# Patient Record
Sex: Female | Born: 1985 | Race: White | Hispanic: No | Marital: Married | Smoking: Never smoker
Health system: Southern US, Community
[De-identification: ages and names within clinical notes are randomized; demographics above are authoritative.]

## PROBLEM LIST (undated history)

## (undated) DIAGNOSIS — Z1501 Genetic susceptibility to malignant neoplasm of breast: Secondary | ICD-10-CM

## (undated) DIAGNOSIS — F419 Anxiety disorder, unspecified: Secondary | ICD-10-CM

## (undated) HISTORY — PX: COSMETIC SURGERY: SHX468

## (undated) HISTORY — DX: Anxiety disorder, unspecified: F41.9

## (undated) HISTORY — PX: BREAST SURGERY: SHX581

## (undated) HISTORY — DX: Genetic susceptibility to malignant neoplasm of breast: Z15.01

---

## 2016-01-07 HISTORY — PX: LAPAROSCOPIC BILATERAL SALPINGO OOPHERECTOMY: SHX5890

## 2020-01-18 ENCOUNTER — Other Ambulatory Visit: Payer: Self-pay

## 2020-01-18 ENCOUNTER — Emergency Department (HOSPITAL_COMMUNITY): Payer: 59

## 2020-01-18 ENCOUNTER — Emergency Department (HOSPITAL_COMMUNITY)
Admission: EM | Admit: 2020-01-18 | Discharge: 2020-01-18 | Disposition: A | Discharge status: Home / Self Care | Payer: 59 | Attending: Emergency Medicine | Admitting: Emergency Medicine

## 2020-01-18 DIAGNOSIS — W540XXA Bitten by dog, initial encounter: Secondary | ICD-10-CM | POA: Diagnosis not present

## 2020-01-18 DIAGNOSIS — Y9389 Activity, other specified: Secondary | ICD-10-CM | POA: Diagnosis not present

## 2020-01-18 DIAGNOSIS — S6992XA Unspecified injury of left wrist, hand and finger(s), initial encounter: Secondary | ICD-10-CM | POA: Diagnosis present

## 2020-01-18 DIAGNOSIS — S62661B Nondisplaced fracture of distal phalanx of left index finger, initial encounter for open fracture: Secondary | ICD-10-CM | POA: Diagnosis not present

## 2020-01-18 DIAGNOSIS — Z23 Encounter for immunization: Secondary | ICD-10-CM | POA: Insufficient documentation

## 2020-01-18 MED ORDER — TETANUS-DIPHTH-ACELL PERTUSSIS 5-2.5-18.5 LF-MCG/0.5 IM SUSY
0.5000 mL | PREFILLED_SYRINGE | Freq: Once | INTRAMUSCULAR | Status: AC
  Administered 2020-01-18: 0.5 mL via INTRAMUSCULAR
  Filled 2020-01-18: qty 0.5

## 2020-01-18 MED ORDER — AMOXICILLIN-POT CLAVULANATE 875-125 MG PO TABS: 1.0000 | ORAL_TABLET | Freq: Two times a day (BID) | ORAL | 0 refills | Status: AC

## 2020-01-18 NOTE — Discharge Instructions (Signed)
Keep the area clean and dry.  Monitor for signs of infection which include worsening redness, swelling, pus discharge, fevers, severe pain to the finger.  Please wear the splint as much as possible.  Please take the antibiotic as prescribed until finished.  Please call Dr. Shelbie Ammons office at [facility] and schedule an appointment for sometime next week for follow-up.  You may take Tylenol or ibuprofen for pain.  Return to the ER for any new or worsening symptoms.

## 2020-01-18 NOTE — Progress Notes (Signed)
Orthopedic Tech Progress Note Patient Details:  Joyce Joseph Mar 31, 1985 [id number]  Ortho Devices Type of Ortho Device: Finger splint Ortho Device/Splint Location: lue index finger splint Ortho Device/Splint Interventions: Ordered,Application,Adjustment   Post Interventions Patient Tolerated: Well Instructions Provided: Care of device,Adjustment of device   Karolee Stamps 01/18/2020, 9:37 PM

## 2020-01-18 NOTE — ED Triage Notes (Signed)
Pt presents after was bitten by her dog. Appears to have punctured through L index fingernail through to the other side. Dog up to date on all shots.

## 2020-01-18 NOTE — ED Notes (Signed)
Pt's wound irrigated & dressed

## 2020-01-18 NOTE — ED Provider Notes (Signed)
[place] EMERGENCY DEPARTMENT Provider Note   CSN: [id number] Arrival date & time: 01/18/20  1846     History Chief Complaint  Patient presents with  . Finger Injury    Joyce Joseph is a 35 y.o. female.  HPI 35 year old female who presents to the ER with a dog bite to her left index finger.  She states that she was grooming her dog and had her hand out in front with a treat, and while grooming the dog accidentally bit down on her finger tomorrow.  The dog is up-to-date on the rabies shots.  Patient is unclear of her last tetanus shot.  Bleeding controlled here in the ER.  No numbness or tingling.  Reports crack in her nailbed.    No past medical history on file.  There are no problems to display for this patient.    The histories are not reviewed yet. Please review them in the "History" navigator section and refresh this [place].   OB History   No obstetric history on file.     No family history on file.     Home Medications Prior to Admission medications   Medication Sig Start Date End Date Taking? Authorizing Provider  amoxicillin-clavulanate (AUGMENTIN) 875-125 MG tablet Take 1 tablet by mouth every 12 (twelve) hours for 7 days. 01/18/20 01/25/20 Yes Garald Balding, PA-C    Allergies    Patient has no known allergies.  Review of Systems   Review of Systems  Musculoskeletal: Positive for arthralgias.  Skin: Positive for wound.  Neurological: Negative for numbness.     Physical Exam Updated Vital Signs BP 122/79   Pulse 78   Temp 98.6 F (37 C)   Resp 20   SpO2 98%   Physical Exam Vitals and nursing note reviewed.  Constitutional:      General: She is not in acute distress.    Appearance: She is well-developed and well-nourished.  HENT:     Head: Normocephalic and atraumatic.  Eyes:     Conjunctiva/sclera: Conjunctivae normal.  Cardiovascular:     Rate and Rhythm: Normal rate and regular rhythm.     Heart sounds: No  murmur heard.   Pulmonary:     Effort: Pulmonary effort is normal. No respiratory distress.     Breath sounds: Normal breath sounds.  Abdominal:     Palpations: Abdomen is soft.     Tenderness: There is no abdominal tenderness.  Musculoskeletal:        General: Tenderness and signs of injury present. No swelling, deformity or edema.     Cervical back: Neck supple.     Comments:  Full ROM of the DIP, sensations intact   Skin:    General: Skin is warm and dry.     Capillary Refill: Capillary refill takes less than 2 seconds.     Findings: Erythema present.     Comments: 1 cm horizontal crack to the nailbed above the lunula extending into the lateral nail fold on the right. Small 0.5cm laceration to the volar aspect of finger bad on the left finger.  Neurological:     General: No focal deficit present.     Mental Status: She is alert and oriented to person, place, and time.  Psychiatric:        Mood and Affect: Mood and affect and mood normal.        Behavior: Behavior normal.         ED Results / Procedures /  Treatments   Labs (all labs ordered are listed, but only abnormal results are displayed) Labs Reviewed - No data to display  EKG None  Radiology DG Finger Index Left  Result Date: 01/18/2020 CLINICAL DATA:  Dog bite EXAM: LEFT INDEX FINGER 2+V COMPARISON:  None. FINDINGS: There is a nondisplaced transverse fracture of the tuft of the left index finger. No foreign body. Skin laceration obscured by bandaging. No other osseous abnormality. IMPRESSION: Nondisplaced transverse fracture of the tuft of the left index finger. No foreign body. Electronically Signed   By: Ulyses Jarred M.D.   On: 01/18/2020 19:24    Procedures Procedures (including critical care time)  Medications Ordered in ED Medications  Tdap (BOOSTRIX) injection 0.5 mL (0.5 mLs Intramuscular Given 01/18/20 2006)    ED Course  I have reviewed the triage vital signs and the nursing notes.  Pertinent  labs & imaging results that were available during my care of the patient were reviewed by me and considered in my medical decision making (see chart for details).    MDM Rules/Calculators/A&P                          35 year old female with dog bite to the finger.  Evidence of nail injury.  Plain films with nondisplaced transverse fracture of the tuft of the left index finger.  Tetanus updated here in the ER.  Neurovascularly intact.  Spoke with Dr. Jeannie Fend with hand surgery as there is concern for possible open fracture.  He recommends cleaning it well, wrapping, placing a finger splint and having him follow-up in office.  Will consent Augmentin.  Referral provided.  Encouraged Tylenol/ibuprofen for pain.  Educated on wound care.  Return precautions discussed.  She voiced understanding and is agreeable.   Final Clinical Impression(s) / ED Diagnoses Final diagnoses:  Nondisplaced fracture of distal phalanx of left index finger, initial encounter for open fracture    Rx / DC Orders ED Discharge Orders         Ordered    amoxicillin-clavulanate (AUGMENTIN) 875-125 MG tablet  Every 12 hours        01/18/20 2112           Garald Balding, PA-C 01/18/20 2116    Horton, Alvin Critchley, DO 01/19/20 0000

## 2020-03-06 ENCOUNTER — Telehealth: Payer: 59 | Admitting: Internal Medicine

## 2020-04-04 ENCOUNTER — Telehealth: Payer: 59 | Admitting: Internal Medicine

## 2020-04-10 ENCOUNTER — Telehealth: Payer: 59 | Admitting: Family

## 2020-04-17 ENCOUNTER — Ambulatory Visit: Payer: 59 | Admitting: Nurse Practitioner

## 2020-05-15 ENCOUNTER — Ambulatory Visit (INDEPENDENT_AMBULATORY_CARE_PROVIDER_SITE_OTHER): Payer: 59 | Admitting: Nurse Practitioner

## 2020-05-15 ENCOUNTER — Other Ambulatory Visit: Payer: Self-pay

## 2020-05-15 ENCOUNTER — Encounter: Payer: Self-pay | Admitting: Nurse Practitioner

## 2020-05-15 VITALS — BP 99/61 | HR 72 | Temp 99.0°F | Ht 68.0 in | Wt 135.6 lb

## 2020-05-15 DIAGNOSIS — Z1239 Encounter for other screening for malignant neoplasm of breast: Secondary | ICD-10-CM | POA: Diagnosis not present

## 2020-05-15 DIAGNOSIS — Z9013 Acquired absence of bilateral breasts and nipples: Secondary | ICD-10-CM

## 2020-05-15 DIAGNOSIS — Z8271 Family history of polycystic kidney: Secondary | ICD-10-CM

## 2020-05-15 DIAGNOSIS — Z1502 Genetic susceptibility to malignant neoplasm of ovary: Secondary | ICD-10-CM

## 2020-05-15 DIAGNOSIS — F5101 Primary insomnia: Secondary | ICD-10-CM | POA: Insufficient documentation

## 2020-05-15 DIAGNOSIS — Z1501 Genetic susceptibility to malignant neoplasm of breast: Secondary | ICD-10-CM

## 2020-05-15 DIAGNOSIS — Z90722 Acquired absence of ovaries, bilateral: Secondary | ICD-10-CM | POA: Insufficient documentation

## 2020-05-15 DIAGNOSIS — Z9079 Acquired absence of other genital organ(s): Secondary | ICD-10-CM

## 2020-05-15 DIAGNOSIS — Z01419 Encounter for gynecological examination (general) (routine) without abnormal findings: Secondary | ICD-10-CM | POA: Insufficient documentation

## 2020-05-15 DIAGNOSIS — Z7689 Persons encountering health services in other specified circumstances: Secondary | ICD-10-CM | POA: Insufficient documentation

## 2020-05-15 DIAGNOSIS — Z13820 Encounter for screening for osteoporosis: Secondary | ICD-10-CM

## 2020-05-15 DIAGNOSIS — Z780 Asymptomatic menopausal state: Secondary | ICD-10-CM

## 2020-05-15 DIAGNOSIS — Z1509 Genetic susceptibility to other malignant neoplasm: Secondary | ICD-10-CM

## 2020-05-15 NOTE — Progress Notes (Signed)
New Patient Office Visit  Subjective:  Patient ID: Joyce Joseph, female    DOB: 08/11/1985  Age: 35 y.o. MRN: [id number]  CC:  Chief Complaint  Patient presents with  . New Patient (Initial Visit)    HPI Joyce Joseph presents to establish new primary care provider. She moved to this area about 1 year ago from [place]. She states that she is generally healthy. She states that she is positive for BRCA 1 genetic mutation, as is her sister. Patient states that her mother has metastatic breast cancer. The patient has had prophylactic bilateral mastectomy. The surgery was done along with reconstruction in 2018. She has not had imaging of the breasts since the surgery. Was told by her surgeon that imaging should be done about every three years. The patient states that she also had bilateral salpingectomy and oophorectomy to prevent development of reproductive cancers. She has been in surgical menopause for past three years. She takes citalopram 53m everyday. This helps her manage chronic anxiety, but also helps to control hot flashes. She states that this medication has been working well. She should have a baseline bone density test.  The patient states that her sister, now about 348years old, has polycystic kidney disease. Sister has recently started having dysfunction of the kidneys and hypertension as a result of the PCKD. The patient states that she, herself, has never been evaluated for PCKD.   The patient states that she has been having trouble with sleep. States that she takes an OTC medication most nights to help her fall asleep, though it doesn't['t always help her to stay asleep. She states that she has tried multiple OTC supplements to help her sleep better. Would like to consider prescription alternative but does not want to start anything new today.  She is due to have routine, fasting blood work. She needs to have a physical and pap smear.   Past Medical History:  Diagnosis Date  .  Anxiety    Phreesia 03/03/2020    Past Surgical History:  Procedure Laterality Date  . BREAST SURGERY N/A    [employer]reesia 03/03/2020  . LAPAROSCOPIC BILATERAL SALPINGO OOPHERECTOMY Bilateral 2018    Family History  Problem Relation Age of Onset  . Breast cancer Mother     Social History   Socioeconomic History  . Marital status: Married    Spouse name: Not on file  . Number of children: Not on file  . Years of education: Not on file  . Highest education level: Not on file  Occupational History  . Not on file  Tobacco Use  . Smoking status: Never Smoker  . Smokeless tobacco: Never Used  Substance and Sexual Activity  . Alcohol use: Yes  . Drug use: Never  . Sexual activity: Yes  Other Topics Concern  . Not on file  Social History Narrative  . Not on file   Social Determinants of Health   Financial Resource Strain: Not on file  Food Insecurity: Not on file  Transportation Needs: Not on file  Physical Activity: Not on file  Stress: Not on file  Social Connections: Not on file  Intimate Partner Violence: Not on file    ROS Review of Systems  Constitutional: Negative for activity change, chills and fever.  HENT: Negative for congestion, sinus pressure, sinus pain and sore throat.   Eyes: Negative.   Respiratory: Negative for cough, chest tightness and wheezing.   Cardiovascular: Negative for chest pain and palpitations.  Gastrointestinal: Negative for  constipation, diarrhea, nausea and vomiting.  Endocrine: Positive for heat intolerance. Negative for cold intolerance, polydipsia, polyphagia and polyuria.       Intermittent hot flashes. Well managed on current dose citalopram.   Genitourinary: Negative for dyspareunia, frequency and vaginal discharge.  Musculoskeletal: Negative for back pain and myalgias.  Skin: Negative for rash.  Allergic/Immunologic: Negative.   Neurological: Negative for dizziness, weakness and headaches.  Psychiatric/Behavioral: Positive for  sleep disturbance. The patient is nervous/anxious.   All other systems reviewed and are negative.   Objective:   Today's Vitals   05/15/20 0950  BP: 99/61  Pulse: 72  Temp: 99 F (37.2 C)  SpO2: 100%  Weight: 135 lb 9.6 oz (61.5 kg)  Height: _0  (1.727 m)   Body mass index is 20.62 kg/m.    Physical Exam Vitals and nursing note reviewed.  Constitutional:      Appearance: Normal appearance. She is well-developed.  HENT:     Head: Normocephalic and atraumatic.     Nose: Nose normal.  Eyes:     Extraocular Movements: Extraocular movements intact.     Conjunctiva/sclera: Conjunctivae normal.     Pupils: Pupils are equal, round, and reactive to light.  Cardiovascular:     Rate and Rhythm: Normal rate and regular rhythm.     Pulses: Normal pulses.     Heart sounds: Normal heart sounds.  Pulmonary:     Effort: Pulmonary effort is normal.     Breath sounds: Normal breath sounds.  Abdominal:     Palpations: Abdomen is soft.  Musculoskeletal:        General: Normal range of motion.     Cervical back: Normal range of motion and neck supple.  Skin:    General: Skin is warm and dry.  Neurological:     General: No focal deficit present.     Mental Status: She is alert and oriented to person, place, and time.  Psychiatric:        Mood and Affect: Mood normal.        Behavior: Behavior normal.        Thought Content: Thought content normal.        Judgment: Judgment normal.     Assessment & Plan:  1. Encounter to establish care Appointment today to establish new primary care provider.   2. BRCA1 gene mutation positive in female Bilateral mastectomy with breast reconstruction. Will get MRI of bilateral breasts for screening for breast cancer and to evaluate for any abnormalities.  - MR BREAST BILATERAL WO CONTRAST; Future  3. History of mastectomy, bilateral Bilateral mastectomy with breast reconstruction. Will get MRI of bilateral breasts for screening for breast  cancer and to evaluate for any abnormalities.  - MR BREAST BILATERAL WO CONTRAST; Future  4. Encounter for breast cancer screening other than mammogram Bilateral mastectomy with breast reconstruction. Will get MRI of bilateral breasts for screening for breast cancer and to evaluate for any abnormalities.  - MR BREAST BILATERAL WO CONTRAST; Future  5. History of bilateral salpingo-oophorectomy Surgical menopause for past three years. Will get baseline bone density test for screening for osteoporosis.  - DG Bone Density; Future  6. Encounter for osteoporosis screening in asymptomatic postmenopausal patient Surgical menopause for past three years. Will get baseline bone density test for screening for osteoporosis.  - DG Bone Density; Future  7. Family history of polycystic kidney disease Will get ultrasound of both kidneys for evaluation of PCKD. Labs to be done prior to  next visit to evaluate renal functions. Blood pressure very well managed.  - US Renal; Future  8. Primary insomnia Discussed trial of trazodone at bedtime to help with insomnia. Patient states she would like to consider this and will discuss further at next visit.   Problem List Items Addressed This Visit      Other   Encounter to establish care - Primary   BRCA1 gene mutation positive in female   Relevant Orders   MR BREAST BILATERAL WO CONTRAST   History of mastectomy, bilateral   Relevant Orders   MR BREAST BILATERAL WO CONTRAST   Encounter for breast cancer screening other than mammogram   Relevant Orders   MR BREAST BILATERAL WO CONTRAST   History of bilateral salpingo-oophorectomy   Relevant Orders   DG Bone Density   Encounter for osteoporosis screening in asymptomatic postmenopausal patient   Relevant Orders   DG Bone Density   Family history of polycystic kidney disease   Relevant Orders   US Renal   Primary insomnia      Outpatient Encounter Medications as of 05/15/2020  Medication Sig  .  citalopram (CELEXA) 20 MG tablet Take 20 mg by mouth daily.  . [DISCONTINUED] citalopram (CELEXA) 20 MG tablet 1 tablet   No facility-administered encounter medications on file as of 05/15/2020.    Follow-up: Return in about 4 weeks (around 06/12/2020) for physical with pap - FBW about 1 week prior .   Ronnell Freshwater, NP

## 2020-05-15 NOTE — Patient Instructions (Signed)
Bone Density Test A bone density test uses a type of X-ray to measure the amount of calcium and other minerals in a person's bones. It can measure bone density in the hip and the spine. The test is similar to having a regular X-ray. This test may also be called:  Bone densitometry.  Bone mineral density test.  Dual-energy X-ray absorptiometry (DEXA). You may have this test to:  Diagnose a condition that causes weak or thin bones (osteoporosis).  Screen you for osteoporosis.  Predict your risk for a broken bone (fracture).  Determine how well your osteoporosis treatment is working. Tell a health care provider about:  Any allergies you have.  All medicines you are taking, including vitamins, herbs, eye drops, creams, and over-the-counter medicines.  Any problems you or family members have had with anesthetic medicines.  Any blood disorders you have.  Any surgeries you have had.  Any medical conditions you have.  Whether you are pregnant or may be pregnant.  Any medical tests you have had within the past 14 days that used contrast material. What are the risks? Generally, this is a safe test. However, it does expose you to a small amount of radiation, which can slightly increase your cancer risk. What happens before the test?  Do not take any calcium supplements within the 24 hours before your test.  You will need to remove all metal jewelry, eyeglasses, removable dental appliances, and any other metal objects on your body. What happens during the test?  You will lie down on an exam table. There will be an X-ray generator below you and an imaging device above you.  Other devices, such as boxes or braces, may be used to position your body properly for the scan.  The machine will slowly scan your body. You will need to keep very still while the machine does the scan.  The images will show up on a screen in the room. Images will be examined by a specialist after your test is  finished. The procedure may vary among health care providers and hospitals.   What can I expect after the test? It is up to you to get the results of your test. Ask your health care provider, or the department that is doing the test, when your results will be ready. Summary  A bone density test is an imaging test that uses a type of X-ray to measure the amount of calcium and other minerals in your bones.  The test may be used to diagnose or screen you for a condition that causes weak or thin bones (osteoporosis), predict your risk for a broken bone (fracture), or determine how well your osteoporosis treatment is working.  Do not take any calcium supplements within 24 hours before your test.  Ask your health care provider, or the department that is doing the test, when your results will be ready. This information is not intended to replace advice given to you by your health care provider. Make sure you discuss any questions you have with your health care provider. Document Revised: 06/09/2019 Document Reviewed: 06/09/2019 Elsevier Patient Education  2021 [employer] Inc.  

## 2020-06-11 ENCOUNTER — Other Ambulatory Visit: Payer: Self-pay

## 2020-06-11 DIAGNOSIS — Z1329 Encounter for screening for other suspected endocrine disorder: Secondary | ICD-10-CM

## 2020-06-11 DIAGNOSIS — Z13220 Encounter for screening for lipoid disorders: Secondary | ICD-10-CM

## 2020-06-11 DIAGNOSIS — Z131 Encounter for screening for diabetes mellitus: Secondary | ICD-10-CM

## 2020-06-12 ENCOUNTER — Other Ambulatory Visit: Payer: 59

## 2020-06-12 ENCOUNTER — Other Ambulatory Visit: Payer: Self-pay

## 2020-06-12 DIAGNOSIS — Z131 Encounter for screening for diabetes mellitus: Secondary | ICD-10-CM

## 2020-06-12 DIAGNOSIS — Z13220 Encounter for screening for lipoid disorders: Secondary | ICD-10-CM

## 2020-06-12 DIAGNOSIS — Z1329 Encounter for screening for other suspected endocrine disorder: Secondary | ICD-10-CM

## 2020-06-13 LAB — COMPREHENSIVE METABOLIC PANEL
ALT: 10 IU/L (ref 0–32)
AST: 15 IU/L (ref 0–40)
Albumin/Globulin Ratio: 1.9 (ref 1.2–2.2)
Albumin: 4.7 g/dL (ref 3.8–4.8)
Alkaline Phosphatase: 57 IU/L (ref 44–121)
BUN/Creatinine Ratio: 12 (ref 9–23)
BUN: 12 mg/dL (ref 6–20)
Bilirubin Total: 1 mg/dL (ref 0.0–1.2)
CO2: 25 mmol/L (ref 20–29)
Calcium: 9.5 mg/dL (ref 8.7–10.2)
Chloride: 104 mmol/L (ref 96–106)
Creatinine, Ser: 0.98 mg/dL (ref 0.57–1.00)
Globulin, Total: 2.5 g/dL (ref 1.5–4.5)
Glucose: 42 mg/dL — ABNORMAL LOW (ref 65–99)
Potassium: 4.4 mmol/L (ref 3.5–5.2)
Sodium: 142 mmol/L (ref 134–144)
Total Protein: 7.2 g/dL (ref 6.0–8.5)
eGFR: 76 mL/min/{1.73_m2} (ref >59)

## 2020-06-13 LAB — TSH: TSH: 0.729 u[IU]/mL (ref 0.450–4.500)

## 2020-06-13 LAB — CBC
Hematocrit: 39 % (ref 34.0–46.6)
Hemoglobin: 13 g/dL (ref 11.1–15.9)
MCH: 30.1 pg (ref 26.6–33.0)
MCHC: 33.3 g/dL (ref 31.5–35.7)
MCV: 90 fL (ref 79–97)
Platelets: 228 10*3/uL (ref 150–450)
RBC: 4.32 x10E6/uL (ref 3.77–5.28)
RDW: 12.4 % (ref 11.7–15.4)
WBC: 4.3 10*3/uL (ref 3.4–10.8)

## 2020-06-13 LAB — LIPID PANEL
Chol/HDL Ratio: 2.2 ratio (ref 0.0–4.4)
Cholesterol, Total: 185 mg/dL (ref 100–199)
HDL: 84 mg/dL (ref >39)
LDL Chol Calc (NIH): 83 mg/dL (ref 0–99)
Triglycerides: 104 mg/dL (ref 0–149)
VLDL Cholesterol Cal: 18 mg/dL (ref 5–40)

## 2020-06-13 LAB — HEMOGLOBIN A1C
Est. average glucose Bld gHb Est-mCnc: 103 mg/dL
Hgb A1c MFr Bld: 5.2 % (ref 4.8–5.6)

## 2020-06-13 NOTE — Progress Notes (Signed)
Low blood glucose. Otherwise labs good. Discuss wit patient at visit 6/14

## 2020-06-15 ENCOUNTER — Other Ambulatory Visit: Payer: Self-pay

## 2020-06-19 ENCOUNTER — Encounter: Payer: Self-pay | Admitting: Nurse Practitioner

## 2020-06-19 ENCOUNTER — Other Ambulatory Visit (HOSPITAL_COMMUNITY)
Admission: RE | Admit: 2020-06-19 | Discharge: 2020-06-19 | Disposition: A | Discharge status: Home / Self Care | Payer: 59 | Source: Ambulatory Visit | Attending: Nurse Practitioner | Admitting: Nurse Practitioner

## 2020-06-19 ENCOUNTER — Ambulatory Visit (INDEPENDENT_AMBULATORY_CARE_PROVIDER_SITE_OTHER): Payer: 59 | Admitting: Nurse Practitioner

## 2020-06-19 ENCOUNTER — Other Ambulatory Visit: Payer: Self-pay

## 2020-06-19 ENCOUNTER — Ambulatory Visit
Admission: RE | Admit: 2020-06-19 | Discharge: 2020-06-19 | Disposition: A | Discharge status: Home / Self Care | Payer: 59 | Source: Ambulatory Visit | Attending: Nurse Practitioner | Admitting: Nurse Practitioner

## 2020-06-19 VITALS — BP 98/59 | HR 83 | Temp 97.5°F | Ht 68.0 in | Wt 137.1 lb

## 2020-06-19 DIAGNOSIS — E162 Hypoglycemia, unspecified: Secondary | ICD-10-CM

## 2020-06-19 DIAGNOSIS — Z1509 Genetic susceptibility to other malignant neoplasm: Secondary | ICD-10-CM

## 2020-06-19 DIAGNOSIS — Z01419 Encounter for gynecological examination (general) (routine) without abnormal findings: Secondary | ICD-10-CM

## 2020-06-19 DIAGNOSIS — Z1502 Genetic susceptibility to malignant neoplasm of ovary: Secondary | ICD-10-CM

## 2020-06-19 DIAGNOSIS — Z1239 Encounter for other screening for malignant neoplasm of breast: Secondary | ICD-10-CM

## 2020-06-19 DIAGNOSIS — Z9013 Acquired absence of bilateral breasts and nipples: Secondary | ICD-10-CM

## 2020-06-19 DIAGNOSIS — Z1501 Genetic susceptibility to malignant neoplasm of breast: Secondary | ICD-10-CM | POA: Diagnosis not present

## 2020-06-19 DIAGNOSIS — F330 Major depressive disorder, recurrent, mild: Secondary | ICD-10-CM

## 2020-06-19 MED ORDER — GADOBUTROL 1 MMOL/ML IV SOLN
6.0000 mL | Freq: Once | INTRAVENOUS | Status: AC | PRN
  Administered 2020-06-19: 12:00:00 6 mL via INTRAVENOUS

## 2020-06-19 MED ORDER — CITALOPRAM HYDROBROMIDE 20 MG PO TABS: 20.0000 mg | ORAL_TABLET | Freq: Every day | ORAL | 1 refills | Status: DC

## 2020-06-19 NOTE — Progress Notes (Signed)
Negative MRI bilateral breasts - continue annual evaluation for high risk patients. Yearly MRI

## 2020-06-19 NOTE — Progress Notes (Signed)
Established Patient Office Visit  Subjective:  Patient ID: Joyce Joseph, female    DOB: Oct 04, 1985  Age: 35 y.o. MRN: [id number]  CC:  Chief Complaint  Patient presents with   Annual Exam   Gynecologic Exam    HPI Joyce Joseph presents for annual wellness exam and pap smear. She is scheduled for breast MRI later this afternoon. She has BRCA 1 mutation. She had prophylactic bilateral mastectomy and oophorectomy. She does have uterus and cervix.  Had lab work done prior to this visit. Her blood sugar was low at 42 but her HgbA1c was normal at 5.2. all other labs were normal and results were reviewed with the patient today.  She does not have concerns or complaints today. States that she does need new refill for her citalopram.  She denies chest pain, chest pressure, or shortness of breath. She denies headaches or visual disturbances. She denies abdominal pain, nausea, vomiting, or changes in bowel or bladder habits.    Past Medical History:  Diagnosis Date   Anxiety    Phreesia 03/03/2020    Past Surgical History:  Procedure Laterality Date   BREAST SURGERY N/A    Phreesia 03/03/2020   LAPAROSCOPIC BILATERAL SALPINGO OOPHERECTOMY Bilateral 2018    Family History  Problem Relation Age of Onset   Breast cancer Mother     Social History   Socioeconomic History   Marital status: Married    Spouse name: Not on file   Number of children: Not on file   Years of education: Not on file   Highest education level: Not on file  Occupational History   Not on file  Tobacco Use   Smoking status: Never   Smokeless tobacco: Never  Substance and Sexual Activity   Alcohol use: Yes   Drug use: Never   Sexual activity: Yes  Other Topics Concern   Not on file  Social History Narrative   Not on file   Social Determinants of Health   Financial Resource Strain: Not on file  Food Insecurity: Not on file  Transportation Needs: Not on file  Physical Activity: Not on file   Stress: Not on file  Social Connections: Not on file  Intimate Partner Violence: Not on file    Outpatient Medications Prior to Visit  Medication Sig Dispense Refill   citalopram (CELEXA) 20 MG tablet Take 20 mg by mouth daily.     No facility-administered medications prior to visit.    No Known Allergies  ROS Review of Systems  Constitutional:  Negative for activity change, chills, fatigue and fever.  HENT:  Negative for congestion, postnasal drip, rhinorrhea, sinus pressure and sinus pain.   Eyes: Negative.   Respiratory:  Negative for cough, chest tightness, shortness of breath and wheezing.   Cardiovascular:  Negative for chest pain and palpitations.  Gastrointestinal:  Negative for constipation, diarrhea, nausea and vomiting.  Endocrine: Negative for cold intolerance, heat intolerance, polydipsia and polyuria.  Genitourinary:  Negative for dysuria, flank pain, frequency and urgency.  Musculoskeletal: Negative.  Negative for back pain and myalgias.  Skin:  Negative for rash.  Allergic/Immunologic: Negative.   Neurological:  Negative for dizziness, weakness and headaches.  Hematological: Negative.   Psychiatric/Behavioral:  The patient is nervous/anxious.      Objective:    Physical Exam Vitals and nursing note reviewed. Exam conducted with a chaperone present.  Constitutional:      Appearance: Normal appearance. She is well-developed.  HENT:     Head: Normocephalic  and atraumatic.     Right Ear: Ear canal and external ear normal.     Left Ear: Ear canal and external ear normal.     Nose: Nose normal.     Mouth/Throat:     Mouth: Mucous membranes are moist.     Pharynx: Oropharynx is clear.  Eyes:     Extraocular Movements: Extraocular movements intact.     Conjunctiva/sclera: Conjunctivae normal.     Pupils: Pupils are equal, round, and reactive to light.  Cardiovascular:     Rate and Rhythm: Normal rate and regular rhythm.     Pulses: Normal pulses.      Heart sounds: Normal heart sounds.  Pulmonary:     Effort: Pulmonary effort is normal.     Breath sounds: Normal breath sounds.  Chest:  Breasts:    Right: Absent. No axillary adenopathy or supraclavicular adenopathy.     Left: Absent. No axillary adenopathy or supraclavicular adenopathy.     Comments: Bilateral breast implants present  Abdominal:     General: Bowel sounds are normal.     Palpations: Abdomen is soft.     Tenderness: There is no abdominal tenderness.  Genitourinary:    Exam position: Supine.     Labia:        Right: No rash, tenderness or lesion.        Left: No rash, tenderness or lesion.      Vagina: No vaginal discharge, erythema, tenderness or bleeding.     Cervix: No cervical motion tenderness, discharge, friability, lesion, erythema or cervical bleeding.     Uterus: Normal.      Adnexa: Right adnexa normal and left adnexa normal.     Comments: No tenderness, masses, or organomeglay present during bimanual exam .   Musculoskeletal:        General: Normal range of motion.     Cervical back: Normal range of motion and neck supple.  Lymphadenopathy:     Cervical: No cervical adenopathy.     Upper Body:     Right upper body: No supraclavicular, axillary or pectoral adenopathy.     Left upper body: No supraclavicular, axillary or pectoral adenopathy.  Skin:    General: Skin is warm and dry.     Capillary Refill: Capillary refill takes less than 2 seconds.  Neurological:     General: No focal deficit present.     Mental Status: She is alert and oriented to person, place, and time.  Psychiatric:        Mood and Affect: Mood normal.        Behavior: Behavior normal.        Thought Content: Thought content normal.        Judgment: Judgment normal.    Today's Vitals   06/19/20 0842  BP: (!) 98/59  Pulse: 83  Temp: (!) 97.5 F (36.4 C)  SpO2: 97%  Weight: 137 lb 1.6 oz (62.2 kg)  Height: $Remove'5\' 8"'vxKytFc$  (1.727 m)   Body mass index is 20.85 kg/m.   Wt Readings  from Last 3 Encounters:  06/19/20 137 lb 1.6 oz (62.2 kg)  05/15/20 135 lb 9.6 oz (61.5 kg)     Health Maintenance Due  Topic Date Due   HIV Screening  Never done   Hepatitis C Screening  Never done   PAP SMEAR-Modifier  Never done   COVID-19 Vaccine (3 - Booster for Pfizer series) 09/30/2019    There are no preventive care reminders to display for  this patient.  Lab Results  Component Value Date   TSH 0.729 06/12/2020   Lab Results  Component Value Date   WBC 4.3 06/12/2020   HGB 13.0 06/12/2020   HCT 39.0 06/12/2020   MCV 90 06/12/2020   PLT 228 06/12/2020   Lab Results  Component Value Date   NA 142 06/12/2020   K 4.4 06/12/2020   CO2 25 06/12/2020   GLUCOSE 42 (L) 06/12/2020   BUN 12 06/12/2020   CREATININE 0.98 06/12/2020   BILITOT 1.0 06/12/2020   ALKPHOS 57 06/12/2020   AST 15 06/12/2020   ALT 10 06/12/2020   PROT 7.2 06/12/2020   ALBUMIN 4.7 06/12/2020   CALCIUM 9.5 06/12/2020   EGFR 76 06/12/2020   Lab Results  Component Value Date   CHOL 185 06/12/2020   Lab Results  Component Value Date   HDL 84 06/12/2020   Lab Results  Component Value Date   LDLCALC 83 06/12/2020   Lab Results  Component Value Date   TRIG 104 06/12/2020   Lab Results  Component Value Date   CHOLHDL 2.2 06/12/2020   Lab Results  Component Value Date   HGBA1C 5.2 06/12/2020      Assessment & Plan:  1. Well woman exam with routine gynecological exam Annual health maintenance exam with pap smear today.  - Cytology - PAP( [place])  2. Mild recurrent major depression (HCC) Stable. Continue citalopram 20mg  daily. Refills sent to her pharmacy.  - citalopram (CELEXA) 20 MG tablet; Take 1 tablet (20 mg total) by mouth daily.  Dispense: 90 tablet; Refill: 1  3. Hypoglycemia Reviewed labs with patient that did show low blood sugar at 42 with normal HgbA1c at 5.2. encouraged her to increase frequency and amount of protein in the diet to keep blood sugar normal and  stable. She voiced understanding of instructions.   4. BRCA1 gene mutation positive in female Patient has had prophylactic bilateral mastectomy and oophorectomy.  Scheduled for bilateral breast MRI later today.   Problem List Items Addressed This Visit       Endocrine   Hypoglycemia     Other   BRCA1 gene mutation positive in female   Well woman exam with routine gynecological exam - Primary   Relevant Orders   Cytology - PAP( [place])   Mild recurrent major depression ([place])   Relevant Medications   citalopram (CELEXA) 20 MG tablet    Meds ordered this encounter  Medications   citalopram (CELEXA) 20 MG tablet    Sig: Take 1 tablet (20 mg total) by mouth daily.    Dispense:  90 tablet    Refill:  1    Order Specific Question:   Supervising Provider    Answer:   Beatrice Lecher D [[id number]]    Follow-up: Return in about 6 months (around 12/19/2020) for mild depression.    Ronnell Freshwater, NP

## 2020-06-20 ENCOUNTER — Other Ambulatory Visit: Payer: Self-pay | Admitting: Nurse Practitioner

## 2020-06-20 ENCOUNTER — Encounter: Payer: Self-pay | Admitting: Nurse Practitioner

## 2020-06-20 DIAGNOSIS — Z803 Family history of malignant neoplasm of breast: Secondary | ICD-10-CM

## 2020-06-20 DIAGNOSIS — Z1509 Genetic susceptibility to other malignant neoplasm: Secondary | ICD-10-CM

## 2020-06-20 DIAGNOSIS — D480 Neoplasm of uncertain behavior of bone and articular cartilage: Secondary | ICD-10-CM

## 2020-06-20 NOTE — Progress Notes (Signed)
Whole body bone scan ordered as there was Indeterminate enhancing lesion in the left sternum measuring 8 mm, of uncertain etiology. Patient has BRCA 1 mutation and has strong family history of metastatic breast cancer.

## 2020-06-21 ENCOUNTER — Encounter: Payer: Self-pay | Admitting: Nurse Practitioner

## 2020-06-21 LAB — CYTOLOGY - PAP
Comment: NEGATIVE
Diagnosis: NEGATIVE
High risk HPV: NEGATIVE

## 2020-06-21 NOTE — Progress Notes (Signed)
Pap results normal. Mychart message sent to patient.

## 2020-06-26 ENCOUNTER — Ambulatory Visit
Admission: RE | Admit: 2020-06-26 | Discharge: 2020-06-26 | Disposition: A | Discharge status: Home / Self Care | Payer: 59 | Source: Ambulatory Visit | Attending: Nurse Practitioner | Admitting: Nurse Practitioner

## 2020-06-26 DIAGNOSIS — Z8271 Family history of polycystic kidney: Secondary | ICD-10-CM

## 2020-06-27 ENCOUNTER — Encounter: Payer: Self-pay | Admitting: Nurse Practitioner

## 2020-06-27 NOTE — Telephone Encounter (Signed)
Hey guys. I think I placed the order for this last week, as advised per radiology. How can I check, or can you check if this is going through scheduling? Thanks so much. -HB

## 2020-06-27 NOTE — Progress Notes (Signed)
Normal ultrasound. MyChart message sent to patient.

## 2020-06-28 NOTE — Telephone Encounter (Signed)
Pt is schedule at [facility]

## 2020-07-12 ENCOUNTER — Encounter: Payer: Self-pay | Admitting: Nurse Practitioner

## 2020-07-12 ENCOUNTER — Other Ambulatory Visit: Payer: Self-pay

## 2020-07-13 NOTE — Telephone Encounter (Signed)
This has been done. AS, CMA 

## 2020-07-16 ENCOUNTER — Ambulatory Visit (HOSPITAL_COMMUNITY): Payer: 59

## 2020-07-16 ENCOUNTER — Encounter (HOSPITAL_COMMUNITY): Payer: Self-pay

## 2020-07-16 ENCOUNTER — Encounter (HOSPITAL_COMMUNITY): Payer: 59

## 2020-07-16 ENCOUNTER — Telehealth: Payer: Self-pay | Admitting: Nurse Practitioner

## 2020-07-16 NOTE — Telephone Encounter (Signed)
Patient imaging did require PA. PA was obtained.    IQ#[id number] Active dates: 07/16/20-08/30/20.  Called centralized scheduling and gave this PA information. Also contacted patient to advise this PA had been obtained and that they should have no further issues with scheduling this imaging. AS, CMA

## 2020-07-16 NOTE — Telephone Encounter (Signed)
Patient's husband states he needs this prior authorization for the bone scan and it will be cancelled without a pre authorization. The mri results and bracket one gene mutation family history sent to [phone]. Please advise, thanks.

## 2020-07-16 NOTE — Telephone Encounter (Signed)
Patient needs a prior authorization for her bone scan at [facility] long. Case number is [id number] and the provider needs to provide clinical information for support the bone scan and [place] needs it to keep an appointment as it keeps getting cancelled. Thanks

## 2020-07-19 ENCOUNTER — Encounter (HOSPITAL_COMMUNITY)
Admission: RE | Admit: 2020-07-19 | Discharge: 2020-07-19 | Disposition: A | Discharge status: Home / Self Care | Payer: 59 | Source: Ambulatory Visit | Attending: Nurse Practitioner | Admitting: Nurse Practitioner

## 2020-07-19 ENCOUNTER — Other Ambulatory Visit: Payer: Self-pay

## 2020-07-19 DIAGNOSIS — D480 Neoplasm of uncertain behavior of bone and articular cartilage: Secondary | ICD-10-CM | POA: Diagnosis not present

## 2020-07-19 DIAGNOSIS — Z803 Family history of malignant neoplasm of breast: Secondary | ICD-10-CM | POA: Insufficient documentation

## 2020-07-19 DIAGNOSIS — Z1509 Genetic susceptibility to other malignant neoplasm: Secondary | ICD-10-CM | POA: Diagnosis present

## 2020-07-19 DIAGNOSIS — Z1501 Genetic susceptibility to malignant neoplasm of breast: Secondary | ICD-10-CM | POA: Insufficient documentation

## 2020-07-19 DIAGNOSIS — Z1502 Genetic susceptibility to malignant neoplasm of ovary: Secondary | ICD-10-CM | POA: Insufficient documentation

## 2020-07-19 MED ORDER — TECHNETIUM TC 99M MEDRONATE IV KIT
20.0000 | PACK | Freq: Once | INTRAVENOUS | Status: AC
  Administered 2020-07-19: 20 via INTRAVENOUS

## 2020-07-22 ENCOUNTER — Encounter: Payer: Self-pay | Admitting: Nurse Practitioner

## 2020-07-22 NOTE — Progress Notes (Signed)
Bone scan results look good. MyChart message sent to patient.

## 2020-09-04 ENCOUNTER — Other Ambulatory Visit: Payer: Self-pay

## 2020-09-04 ENCOUNTER — Ambulatory Visit
Admission: RE | Admit: 2020-09-04 | Discharge: 2020-09-04 | Disposition: A | Discharge status: Home / Self Care | Payer: 59 | Source: Ambulatory Visit | Attending: Nurse Practitioner | Admitting: Nurse Practitioner

## 2020-09-04 DIAGNOSIS — Z13820 Encounter for screening for osteoporosis: Secondary | ICD-10-CM

## 2020-09-04 DIAGNOSIS — Z90722 Acquired absence of ovaries, bilateral: Secondary | ICD-10-CM

## 2020-09-13 ENCOUNTER — Encounter: Payer: Self-pay | Admitting: Nurse Practitioner

## 2020-09-13 ENCOUNTER — Other Ambulatory Visit: Payer: Self-pay

## 2020-09-13 DIAGNOSIS — F330 Major depressive disorder, recurrent, mild: Secondary | ICD-10-CM

## 2020-09-13 MED ORDER — CITALOPRAM HYDROBROMIDE 20 MG PO TABS: 20.0000 mg | ORAL_TABLET | Freq: Every day | ORAL | 0 refills | Status: DC

## 2020-09-13 NOTE — Progress Notes (Signed)
Bone density showing osteopenia without osteoporosis.  Recommend daily dosing of calcium with vitamin D.  Repeat bone density in 2 to 3 years.  MyChart message sent to patient.

## 2020-10-30 ENCOUNTER — Other Ambulatory Visit: Payer: 59

## 2020-11-12 ENCOUNTER — Other Ambulatory Visit: Payer: Self-pay | Admitting: Nurse Practitioner

## 2020-11-12 DIAGNOSIS — F330 Major depressive disorder, recurrent, mild: Secondary | ICD-10-CM

## 2020-12-15 ENCOUNTER — Other Ambulatory Visit: Payer: Self-pay | Admitting: Nurse Practitioner

## 2020-12-15 DIAGNOSIS — F330 Major depressive disorder, recurrent, mild: Secondary | ICD-10-CM

## 2020-12-18 ENCOUNTER — Ambulatory Visit: Payer: 59 | Admitting: Nurse Practitioner

## 2020-12-29 ENCOUNTER — Other Ambulatory Visit: Payer: Self-pay | Admitting: Nurse Practitioner

## 2020-12-29 DIAGNOSIS — F330 Major depressive disorder, recurrent, mild: Secondary | ICD-10-CM

## 2021-01-01 ENCOUNTER — Telehealth: Payer: Self-pay | Admitting: Nurse Practitioner

## 2021-01-01 ENCOUNTER — Other Ambulatory Visit: Payer: Self-pay | Admitting: Nurse Practitioner

## 2021-01-01 DIAGNOSIS — F330 Major depressive disorder, recurrent, mild: Secondary | ICD-10-CM

## 2021-01-01 MED ORDER — CITALOPRAM HYDROBROMIDE 20 MG PO TABS: 20.0000 mg | ORAL_TABLET | Freq: Every day | ORAL | 1 refills | Status: DC

## 2021-01-01 NOTE — Telephone Encounter (Signed)
Patient called requesting refill of citalopram? [phone]

## 2021-01-01 NOTE — Telephone Encounter (Signed)
I have filled this with a 30 day prescription, but please let her know that she needs to be seen for additional refills. Thanks.

## 2021-02-27 ENCOUNTER — Other Ambulatory Visit: Payer: Self-pay | Admitting: Nurse Practitioner

## 2021-02-27 DIAGNOSIS — F330 Major depressive disorder, recurrent, mild: Secondary | ICD-10-CM

## 2021-03-11 ENCOUNTER — Encounter: Payer: Self-pay | Admitting: Nurse Practitioner

## 2021-03-11 ENCOUNTER — Other Ambulatory Visit: Payer: Self-pay | Admitting: Nurse Practitioner

## 2021-03-11 DIAGNOSIS — F330 Major depressive disorder, recurrent, mild: Secondary | ICD-10-CM

## 2021-03-18 ENCOUNTER — Ambulatory Visit (INDEPENDENT_AMBULATORY_CARE_PROVIDER_SITE_OTHER): Payer: 59 | Admitting: Nurse Practitioner

## 2021-03-18 ENCOUNTER — Encounter: Payer: Self-pay | Admitting: Nurse Practitioner

## 2021-03-18 ENCOUNTER — Other Ambulatory Visit: Payer: Self-pay

## 2021-03-18 DIAGNOSIS — F330 Major depressive disorder, recurrent, mild: Secondary | ICD-10-CM | POA: Diagnosis not present

## 2021-03-18 MED ORDER — CITALOPRAM HYDROBROMIDE 20 MG PO TABS: 20.0000 mg | ORAL_TABLET | Freq: Every day | ORAL | 1 refills | Status: DC

## 2021-03-18 NOTE — Progress Notes (Signed)
Virtual Visit via Telephone Note ? ?I connected with Benedetto Coons on 03/18/21 at  1:10 PM EDT by telephone and verified that I am speaking with the correct person using two identifiers. ? ?Location: ?Patient: home ?Provider: [facility]   ?  ?I discussed the limitations, risks, security and privacy concerns of performing an evaluation and management service by telephone and the availability of in person appointments. I also discussed with the patient that there may be a patient responsible charge related to this service. The patient expressed understanding and agreed to proceed. ? ? ?History of Present Illness: ?The patient presents for routine follow up visit. States that she is doing well. She denies chest pain, chest pressure, or shortness of breath. She denies headaches or visual disturbances. she denies abdominal pain, nausea, vomiting, or changes in bowel or bladder habits.  She states that current dose of citalopram keeps emotions and mood balanced. She feels like she needs it and does not want to stop.  ?  ?Observations/Objective: ? ?The patient is alert and oriented. She is pleasant and answers all questions appropriately. Breathing is non-labored. She is in no acute distress at this time.   ? ?Today's Vitals  ? 03/18/21 1135  ?Weight: 137 lb (62.1 kg)  ?Height: '5\' 8"'$  (1.727 m)  ? ?Body mass index is 20.83 kg/m?.  ? ?Assessment and Plan: ?1. Mild recurrent major depression ([place]) ?Well managed. Continue citalpram '20mg'$  daily. New, 90 day prescription sent to her pharmacy today.  ?- citalopram (CELEXA) 20 MG tablet; Take 1 tablet (20 mg total) by mouth daily. NEEDS APT FOR REFILLS  Dispense: 90 tablet; Refill: 1  ? ?Follow Up Instructions: ? ?  ?I discussed the assessment and treatment plan with the patient. The patient was provided an opportunity to ask questions and all were answered. The patient agreed with the plan and demonstrated an understanding of the instructions. ?  ?The  patient was advised to call back or seek an in-person evaluation if the symptoms worsen or if the condition fails to improve as anticipated. ? ?I provided 10 minutes of non-face-to-face time during this encounter. ? ? ?Ronnell Freshwater, NP  ? ?Return in about 3 months (around 06/18/2021) for health maintenance exam, FBW a week prior to visit with free t3 and vitamin d .  ?  ?

## 2021-06-12 ENCOUNTER — Other Ambulatory Visit: Payer: Self-pay

## 2021-06-12 DIAGNOSIS — Z0000 Encounter for general adult medical examination without abnormal findings: Secondary | ICD-10-CM

## 2021-06-12 DIAGNOSIS — E569 Vitamin deficiency, unspecified: Secondary | ICD-10-CM

## 2021-06-12 DIAGNOSIS — E782 Mixed hyperlipidemia: Secondary | ICD-10-CM

## 2021-06-12 DIAGNOSIS — E039 Hypothyroidism, unspecified: Secondary | ICD-10-CM

## 2021-06-13 ENCOUNTER — Other Ambulatory Visit: Payer: 59

## 2021-06-13 DIAGNOSIS — E039 Hypothyroidism, unspecified: Secondary | ICD-10-CM

## 2021-06-13 DIAGNOSIS — Z0000 Encounter for general adult medical examination without abnormal findings: Secondary | ICD-10-CM

## 2021-06-13 DIAGNOSIS — E569 Vitamin deficiency, unspecified: Secondary | ICD-10-CM

## 2021-06-14 LAB — LIPID PANEL
Chol/HDL Ratio: 2.3 ratio (ref 0.0–4.4)
Cholesterol, Total: 204 mg/dL — ABNORMAL HIGH (ref 100–199)
HDL: 87 mg/dL (ref >39)
LDL Chol Calc (NIH): 105 mg/dL — ABNORMAL HIGH (ref 0–99)
Triglycerides: 64 mg/dL (ref 0–149)
VLDL Cholesterol Cal: 12 mg/dL (ref 5–40)

## 2021-06-14 LAB — VITAMIN D 25 HYDROXY (VIT D DEFICIENCY, FRACTURES): Vit D, 25-Hydroxy: 43.5 ng/mL (ref 30.0–100.0)

## 2021-06-14 LAB — COMPREHENSIVE METABOLIC PANEL
ALT: 7 IU/L (ref 0–32)
AST: 15 IU/L (ref 0–40)
Albumin/Globulin Ratio: 2.3 — ABNORMAL HIGH (ref 1.2–2.2)
Albumin: 5 g/dL — ABNORMAL HIGH (ref 3.8–4.8)
Alkaline Phosphatase: 55 IU/L (ref 44–121)
BUN/Creatinine Ratio: 16 (ref 9–23)
BUN: 15 mg/dL (ref 6–20)
Bilirubin Total: 1 mg/dL (ref 0.0–1.2)
CO2: 27 mmol/L (ref 20–29)
Calcium: 9.8 mg/dL (ref 8.7–10.2)
Chloride: 100 mmol/L (ref 96–106)
Creatinine, Ser: 0.91 mg/dL (ref 0.57–1.00)
Globulin, Total: 2.2 g/dL (ref 1.5–4.5)
Glucose: 82 mg/dL (ref 70–99)
Potassium: 4.7 mmol/L (ref 3.5–5.2)
Sodium: 140 mmol/L (ref 134–144)
Total Protein: 7.2 g/dL (ref 6.0–8.5)
eGFR: 83 mL/min/{1.73_m2} (ref >59)

## 2021-06-14 LAB — CBC WITH DIFFERENTIAL/PLATELET
Basophils Absolute: 0.1 10*3/uL (ref 0.0–0.2)
Basos: 1 %
EOS (ABSOLUTE): 0.2 10*3/uL (ref 0.0–0.4)
Eos: 4 %
Hematocrit: 40.2 % (ref 34.0–46.6)
Hemoglobin: 13.3 g/dL (ref 11.1–15.9)
Immature Grans (Abs): 0 10*3/uL (ref 0.0–0.1)
Immature Granulocytes: 0 %
Lymphocytes Absolute: 1.7 10*3/uL (ref 0.7–3.1)
Lymphs: 42 %
MCH: 29.4 pg (ref 26.6–33.0)
MCHC: 33.1 g/dL (ref 31.5–35.7)
MCV: 89 fL (ref 79–97)
Monocytes Absolute: 0.4 10*3/uL (ref 0.1–0.9)
Monocytes: 10 %
Neutrophils Absolute: 1.7 10*3/uL (ref 1.4–7.0)
Neutrophils: 43 %
Platelets: 219 10*3/uL (ref 150–450)
RBC: 4.52 x10E6/uL (ref 3.77–5.28)
RDW: 12.1 % (ref 11.7–15.4)
WBC: 4.1 10*3/uL (ref 3.4–10.8)

## 2021-06-14 LAB — HEMOGLOBIN A1C
Est. average glucose Bld gHb Est-mCnc: 105 mg/dL
Hgb A1c MFr Bld: 5.3 % (ref 4.8–5.6)

## 2021-06-14 LAB — T3, FREE: T3, Free: 3.1 pg/mL (ref 2.0–4.4)

## 2021-06-14 LAB — TSH: TSH: 1.14 u[IU]/mL (ref 0.450–4.500)

## 2021-06-16 NOTE — Progress Notes (Signed)
Overall, labs good. Discuss with patient at visit 06/20/2021

## 2021-06-20 ENCOUNTER — Encounter: Payer: 59 | Admitting: Nurse Practitioner

## 2021-07-01 ENCOUNTER — Ambulatory Visit (INDEPENDENT_AMBULATORY_CARE_PROVIDER_SITE_OTHER): Payer: 59 | Admitting: Nurse Practitioner

## 2021-07-01 ENCOUNTER — Encounter: Payer: Self-pay | Admitting: Nurse Practitioner

## 2021-07-01 VITALS — BP 98/60 | HR 71 | Temp 97.7°F | Ht 68.11 in | Wt 135.4 lb

## 2021-07-01 DIAGNOSIS — E785 Hyperlipidemia, unspecified: Secondary | ICD-10-CM

## 2021-07-01 DIAGNOSIS — F330 Major depressive disorder, recurrent, mild: Secondary | ICD-10-CM

## 2021-07-01 DIAGNOSIS — Z1501 Genetic susceptibility to malignant neoplasm of breast: Secondary | ICD-10-CM | POA: Diagnosis not present

## 2021-07-01 DIAGNOSIS — D485 Neoplasm of uncertain behavior of skin: Secondary | ICD-10-CM

## 2021-07-01 DIAGNOSIS — Z0001 Encounter for general adult medical examination with abnormal findings: Secondary | ICD-10-CM | POA: Diagnosis not present

## 2021-07-01 DIAGNOSIS — Z1502 Genetic susceptibility to malignant neoplasm of ovary: Secondary | ICD-10-CM

## 2021-07-01 DIAGNOSIS — Z1509 Genetic susceptibility to other malignant neoplasm: Secondary | ICD-10-CM

## 2021-07-01 MED ORDER — CITALOPRAM HYDROBROMIDE 20 MG PO TABS: 20.0000 mg | ORAL_TABLET | Freq: Every day | ORAL | 3 refills | Status: DC

## 2021-07-01 NOTE — Progress Notes (Signed)
Complete physical exam   Patient: Joyce Joseph   DOB: 24-Jun-1985   36 y.o. Female  MRN: [id number] Visit Date: 07/01/2021    Chief Complaint  Patient presents with   Annual Exam   Subjective    Joyce Joseph is a 36 y.o. female who presents today for a complete physical exam.  She reports consuming a generally healthy diet. Home exercise routine includes walking 1 hrs per days. Approximately 4 - 5 times per week. She generally feels well. She does not have additional problems to discuss today.   HPI  Annual physical.  -routine, fasting labs done prior to this visit --mild elevation of LDL and total cholesterol  --other labs normal.  ? If due for new MRI of breasts for cancer screening - had negative MRI last year with annual high risk breast MRI screening recommended  -has noted increased "skin spots" as she has gotten older. States that her mom had to have several spots removed when she was younger. Patient is unsure if these were cancerous lesions.   Past Medical History:  Diagnosis Date   Anxiety    Phreesia 03/03/2020   Past Surgical History:  Procedure Laterality Date   BREAST SURGERY N/A    Phreesia 03/03/2020   LAPAROSCOPIC BILATERAL SALPINGO OOPHERECTOMY Bilateral 2018   Social History   Socioeconomic History   Marital status: Married    Spouse name: Not on file   Number of children: Not on file   Years of education: Not on file   Highest education level: Not on file  Occupational History   Not on file  Tobacco Use   Smoking status: Never   Smokeless tobacco: Never  Substance and Sexual Activity   Alcohol use: Yes   Drug use: Never   Sexual activity: Yes  Other Topics Concern   Not on file  Social History Narrative   Not on file   Social Determinants of Health   Financial Resource Strain: Not on file  Food Insecurity: Not on file  Transportation Needs: Not on file  Physical Activity: Not on file  Stress: Not on file  Social Connections: Not on  file  Intimate Partner Violence: Not on file   Family Status  Relation Name Status   Mother  (Not Specified)   Family History  Problem Relation Age of Onset   Breast cancer Mother    No Known Allergies  Patient Care Team: Ronnell Freshwater, NP as PCP - General (Family Medicine)   Medications: Outpatient Medications Prior to Visit  Medication Sig   [DISCONTINUED] citalopram (CELEXA) 20 MG tablet Take 1 tablet (20 mg total) by mouth daily. NEEDS APT FOR REFILLS   [DISCONTINUED] Magnesium 250 MG TABS 1 tablet with a meal   [DISCONTINUED] Melatonin 10 MG/ML LIQD See admin instructions.   [DISCONTINUED] Multiple Vitamins-Minerals (MULTIVITAMIN ADULT EXTRA C PO) 1 tablet   No facility-administered medications prior to visit.    Review of Systems  Constitutional:  Negative for activity change, appetite change, chills, fatigue and fever.  HENT:  Negative for congestion, postnasal drip, rhinorrhea, sinus pressure, sinus pain, sneezing and sore throat.   Eyes: Negative.   Respiratory:  Negative for cough, chest tightness, shortness of breath and wheezing.   Cardiovascular:  Negative for chest pain and palpitations.  Gastrointestinal:  Negative for abdominal pain, constipation, diarrhea, nausea and vomiting.  Endocrine: Negative for cold intolerance, heat intolerance, polydipsia and polyuria.  Genitourinary:  Negative for dyspareunia, dysuria, flank pain, frequency and urgency.  Musculoskeletal:  Negative for arthralgias, back pain and myalgias.  Skin:  Negative for rash.  Allergic/Immunologic: Negative for environmental allergies.  Neurological:  Negative for dizziness, weakness and headaches.  Hematological:  Negative for adenopathy.  Psychiatric/Behavioral:  The patient is nervous/anxious.     Last CBC Lab Results  Component Value Date   WBC 4.1 06/13/2021   HGB 13.3 06/13/2021   HCT 40.2 06/13/2021   MCV 89 06/13/2021   MCH 29.4 06/13/2021   RDW 12.1 06/13/2021   PLT 219  09/32/6712   Last metabolic panel Lab Results  Component Value Date   GLUCOSE 82 06/13/2021   NA 140 06/13/2021   K 4.7 06/13/2021   CL 100 06/13/2021   CO2 27 06/13/2021   BUN 15 06/13/2021   CREATININE 0.91 06/13/2021   EGFR 83 06/13/2021   CALCIUM 9.8 06/13/2021   PROT 7.2 06/13/2021   ALBUMIN 5.0 (H) 06/13/2021   LABGLOB 2.2 06/13/2021   AGRATIO 2.3 (H) 06/13/2021   BILITOT 1.0 06/13/2021   ALKPHOS 55 06/13/2021   AST 15 06/13/2021   ALT 7 06/13/2021   Last lipids Lab Results  Component Value Date   CHOL 204 (H) 06/13/2021   HDL 87 06/13/2021   LDLCALC 105 (H) 06/13/2021   TRIG 64 06/13/2021   CHOLHDL 2.3 06/13/2021   Last hemoglobin A1c Lab Results  Component Value Date   HGBA1C 5.3 06/13/2021   Last thyroid functions Lab Results  Component Value Date   TSH 1.140 06/13/2021   Last vitamin D Lab Results  Component Value Date   VD25OH 43.5 06/13/2021       Objective     Today's Vitals   07/01/21 1330  BP: 98/60  Pulse: 71  Temp: 97.7 F (36.5 C)  SpO2: 99%  Weight: 135 lb 6.4 oz (61.4 kg)  Height: 5' 8.11" (1.73 m)   Body mass index is 20.52 kg/m.   BP Readings from Last 3 Encounters:  07/01/21 98/60  06/19/20 (!) 98/59  05/15/20 99/61    Wt Readings from Last 3 Encounters:  07/01/21 135 lb 6.4 oz (61.4 kg)  03/18/21 137 lb (62.1 kg)  06/19/20 137 lb 1.6 oz (62.2 kg)     Physical Exam Vitals and nursing note reviewed.  Constitutional:      Appearance: Normal appearance. She is well-developed.  HENT:     Head: Normocephalic and atraumatic.     Right Ear: Tympanic membrane, ear canal and external ear normal.     Left Ear: Tympanic membrane, ear canal and external ear normal.     Nose: Nose normal.     Mouth/Throat:     Mouth: Mucous membranes are moist.     Pharynx: Oropharynx is clear.  Eyes:     Extraocular Movements: Extraocular movements intact.     Conjunctiva/sclera: Conjunctivae normal.     Pupils: Pupils are equal,  round, and reactive to light.  Cardiovascular:     Rate and Rhythm: Normal rate and regular rhythm.     Pulses: Normal pulses.     Heart sounds: Normal heart sounds.  Pulmonary:     Effort: Pulmonary effort is normal.     Breath sounds: Normal breath sounds.  Abdominal:     General: Bowel sounds are normal. There is no distension.     Palpations: Abdomen is soft. There is no mass.     Tenderness: There is no abdominal tenderness. There is no right CVA tenderness, left CVA tenderness, guarding or rebound.  Hernia: No hernia is present.  Musculoskeletal:        General: Normal range of motion.     Cervical back: Normal range of motion and neck supple.  Lymphadenopathy:     Cervical: No cervical adenopathy.  Skin:    General: Skin is warm and dry.     Capillary Refill: Capillary refill takes less than 2 seconds.  Neurological:     General: No focal deficit present.     Mental Status: She is alert and oriented to person, place, and time.  Psychiatric:        Mood and Affect: Mood normal.        Behavior: Behavior normal.        Thought Content: Thought content normal.        Judgment: Judgment normal.     Last depression screening scores    07/01/2021    1:33 PM 03/18/2021   11:36 AM 06/19/2020    8:45 AM  PHQ 2/9 Scores  PHQ - 2 Score 0 0 0  PHQ- 9 Score _0 Last fall risk screening    06/19/2020    8:45 AM  [place] in the past year? 0  Number falls in past yr: 0  Injury with Fall? 0  Follow up Falls evaluation completed     Assessment & Plan    1. Encounter for general adult medical examination with abnormal findings Annual physical today   2. Mild recurrent major depression (HCC) Stable. Continue citalopram at 20 mg daily. Refills sent to pharmacy  - citalopram (CELEXA) 20 MG tablet; Take 1 tablet (20 mg total) by mouth daily. NEEDS APT FOR REFILLS  Dispense: 90 tablet; Refill: 3  3. BRCA1 gene mutation positive in female Patient with history  of preventive bilateral mastectomy and oophorectomy due to positive VRCA1 genetic mutation. Most recent bilateral breast MRI 2022 was negative   4. Dyslipidemia, goal LDL below 100 Recommend patient limit intake of fried and fatty foods. She should increase intake of lean proteins and green leafy vegetables. Adding exercise into daily routine will also be beneficial.    5. Neoplasm of uncertain behavior of skin Refer to dermatology for routine skin check.  - Ambulatory referral to Dermatology    Immunization History  Administered Date(s) Administered   PFIZER(Purple Top)SARS-COV-2 Vaccination 04/09/2019, 04/30/2019, 10/07/2019, 12/23/2019   Tdap 01/18/2020    Health Maintenance  Topic Date Due   COVID-19 Vaccine (5 - Pfizer series) 02/17/2020   Hepatitis C Screening  03/19/2022 (Originally 04/15/2001)   HIV Screening  03/19/2022 (Originally 04/16/1998)   INFLUENZA VACCINE  08/06/2021   PAP SMEAR-Modifier  06/20/2023   TETANUS/TDAP  01/17/2030   HPV VACCINES  Aged Out    Discussed health benefits of physical activity, and encouraged her to engage in regular exercise appropriate for her age and condition.  Problem List Items Addressed This Visit       Other   BRCA1 gene mutation positive in female   Mild recurrent major depression ([place])   Relevant Medications   citalopram (CELEXA) 20 MG tablet   Dyslipidemia, goal LDL below 100   Other Visit Diagnoses     Encounter for general adult medical examination with abnormal findings    -  Primary   Neoplasm of uncertain behavior of skin       Relevant Orders   Ambulatory referral to Dermatology        Return in about 1 year (around  07/02/2022) for health maintenance exam, FBW a week prior to visit.        Ronnell Freshwater, NP  [facility] at [facility] [phone] (phone) [phone] (fax)  [place]

## 2021-07-07 DIAGNOSIS — E785 Hyperlipidemia, unspecified: Secondary | ICD-10-CM | POA: Insufficient documentation

## 2021-09-10 ENCOUNTER — Telehealth: Payer: Self-pay | Admitting: Nurse Practitioner

## 2021-09-10 NOTE — Telephone Encounter (Signed)
Pt called stating her insurance has faxed over a approval letter for a mail order for pt medication (Citalopram 20 mg) for a 90 days supply. The best contact number [phone]. Pt states that [employer] is her new insurance.

## 2021-09-20 ENCOUNTER — Other Ambulatory Visit: Payer: Self-pay

## 2021-09-20 ENCOUNTER — Telehealth: Payer: Self-pay | Admitting: Nurse Practitioner

## 2021-09-20 DIAGNOSIS — F330 Major depressive disorder, recurrent, mild: Secondary | ICD-10-CM

## 2021-09-20 MED ORDER — CITALOPRAM HYDROBROMIDE 20 MG PO TABS: 20.0000 mg | ORAL_TABLET | Freq: Every day | ORAL | 3 refills | Status: DC

## 2021-09-20 NOTE — Telephone Encounter (Signed)
Rx was sent to [facility]

## 2021-09-20 NOTE — Telephone Encounter (Signed)
Patient requesting refill of citalopram and has changed pharmacies to [facility] mail order. Please advise.

## 2022-02-07 ENCOUNTER — Ambulatory Visit: Payer: 59 | Admitting: Internal Medicine

## 2022-02-07 ENCOUNTER — Encounter: Payer: Self-pay | Admitting: Internal Medicine

## 2022-02-07 VITALS — BP 118/76 | HR 72 | Temp 97.9°F | Resp 16 | Ht 68.0 in | Wt 139.1 lb

## 2022-02-07 DIAGNOSIS — F419 Anxiety disorder, unspecified: Secondary | ICD-10-CM

## 2022-02-07 DIAGNOSIS — Z1239 Encounter for other screening for malignant neoplasm of breast: Secondary | ICD-10-CM

## 2022-02-07 NOTE — Progress Notes (Signed)
New Patient Office Visit  Subjective    Patient ID: Joyce Joseph, female    DOB: [birth year]  Age: 37 y.o. MRN: [id number]  CC:  Chief Complaint  Patient presents with   Establish Care    HPI Joyce Joseph presents to establish care.  Anxiety: -Currently on Celexa 20 mg    02/07/2022    2:09 PM 07/01/2021    1:33 PM 03/18/2021   11:36 AM 06/19/2020    8:45 AM 05/15/2020    9:56 AM  Depression screen PHQ 2/9  Decreased Interest 0 0 0 0 0  Down, Depressed, Hopeless 0 0 0 0 1  PHQ - 2 Score 0 0 0 0 1  Altered sleeping '1 1 1 1 3  '$ Tired, decreased energy '1 1 1 1 1  '$ Change in appetite 0 0 0 0 0  Feeling bad or failure about yourself  0 0 0 0 0  Trouble concentrating 0 '1 1 1 1  '$ Moving slowly or fidgety/restless 0 0 0 0 0  Suicidal thoughts 0 0 0 0 0  PHQ-9 Score '2 3 3 3 6  '$ Difficult doing work/chores Not difficult at all       BRCA Mutation Positive:  -History of bilateral salpingo-oophorectomy and bilateral mastectomy  -Breast MRI 06/19/20 negative   Health Maintenance: -Blood work  -Breast cancer screening due  -Pap 6/22 negative   Outpatient Encounter Medications as of 02/07/2022  Medication Sig   citalopram (CELEXA) 20 MG tablet Take 1 tablet (20 mg total) by mouth daily. NEEDS APT FOR REFILLS   No facility-administered encounter medications on file as of 02/07/2022.    Past Medical History:  Diagnosis Date   Anxiety    [employer] 03/03/2020   BRCA gene mutation test positive     Past Surgical History:  Procedure Laterality Date   BREAST SURGERY N/A    [employer] 03/03/2020   COSMETIC SURGERY  12/21/2016   Breast Implants   LAPAROSCOPIC BILATERAL SALPINGO OOPHERECTOMY Bilateral 2018    Family History  Problem Relation Age of Onset   Breast cancer Mother    Cancer Mother    Depression Mother    Kidney disease Sister     Social History   Socioeconomic History   Marital status: Married    Spouse name: Not on file   Number of children: Not on file    Years of education: Not on file   Highest education level: Not on file  Occupational History   Not on file  Tobacco Use   Smoking status: Never   Smokeless tobacco: Never  Vaping Use   Vaping Use: Never used  Substance and Sexual Activity   Alcohol use: Yes    Alcohol/week: 4.0 standard drinks of alcohol    Types: 4 Standard drinks or equivalent per week   Drug use: Never   Sexual activity: Yes    Birth control/protection: Post-menopausal    Comment: No ovaries due to salpingo oopherectomy  Other Topics Concern   Not on file  Social History Narrative   Not on file   Social Determinants of Health   Financial Resource Strain: Not on file  Food Insecurity: Not on file  Transportation Needs: Not on file  Physical Activity: Not on file  Stress: Not on file  Social Connections: Not on file  Intimate Partner Violence: Not on file    Review of Systems  All other systems reviewed and are negative.       Objective  Pulse 72   Temp 97.9 F (36.6 C)   Resp 16   Ht '5\' 8"'$  (1.727 m)   Wt 139 lb 1.6 oz (63.1 kg)   LMP  (LMP Unknown) Comment: Salpingo Oopherectomy May 2018  SpO2 99%   BMI 21.15 kg/m   Physical Exam Constitutional:      Appearance: Normal appearance.  HENT:     Head: Normocephalic and atraumatic.     Mouth/Throat:     Mouth: Mucous membranes are moist.     Pharynx: Oropharynx is clear.  Eyes:     Extraocular Movements: Extraocular movements intact.     Conjunctiva/sclera: Conjunctivae normal.     Pupils: Pupils are equal, round, and reactive to light.  Neck:     Comments: No thyromegaly  Cardiovascular:     Rate and Rhythm: Normal rate and regular rhythm.  Pulmonary:     Effort: Pulmonary effort is normal.     Breath sounds: Normal breath sounds.  Musculoskeletal:     Cervical back: No tenderness.     Right lower leg: No edema.     Left lower leg: No edema.  Lymphadenopathy:     Cervical: No cervical adenopathy.  Skin:    General: Skin  is warm and dry.  Neurological:     General: No focal deficit present.     Mental Status: She is alert. Mental status is at baseline.  Psychiatric:        Mood and Affect: Mood normal.        Behavior: Behavior normal.         Assessment & Plan:   1. Breast cancer screening, high risk patient: Patient is BRCA positive, mother passed away from breast cancer in 2009, patient's sister BRCA positive as well. Patient is s/p bilateral oophorectomy and bilateral mastectomy, last breast cancer screening was in 2022. Will order breast MRI to continue annual screening.   - MR BREAST BILATERAL W WO CONTRAST INC CAD; Future  2. Anxiety: Stable, continue Celexa 20 mg.    Return in about 4 months (around 06/08/2022) for after 6/8 for CPE.   Teodora Medici, DO

## 2022-02-07 NOTE — Patient Instructions (Addendum)
It was great seeing you today!  Plan discussed at today's visit: -Have pharmacy send refill requests here for medications  -Breast MRI ordered for screening   Follow up in: 4 months for fasting labs and annual visit   Take care and let us know if you have any questions or concerns prior to your next visit.  Dr. Rosana Berger

## 2022-03-05 ENCOUNTER — Encounter: Payer: Self-pay | Admitting: Internal Medicine

## 2022-03-25 ENCOUNTER — Telehealth: Payer: Self-pay | Admitting: Internal Medicine

## 2022-03-25 NOTE — Telephone Encounter (Signed)
Patient would like PCP to place MR [place] [facility]  orders with [facility] phone # [phone] [place], [place]. Patient states her insurance will cover this location and not at [facility]. Patient would like a follow up call once orders have been placed at [facility].

## 2022-03-26 ENCOUNTER — Other Ambulatory Visit: Payer: Self-pay | Admitting: Internal Medicine

## 2022-03-26 DIAGNOSIS — Z1501 Genetic susceptibility to malignant neoplasm of breast: Secondary | ICD-10-CM

## 2022-03-26 DIAGNOSIS — Z1239 Encounter for other screening for malignant neoplasm of breast: Secondary | ICD-10-CM

## 2022-05-14 ENCOUNTER — Ambulatory Visit
Admission: RE | Admit: 2022-05-14 | Discharge: 2022-05-14 | Disposition: A | Discharge status: Home / Self Care | Payer: 59 | Source: Ambulatory Visit | Attending: Internal Medicine | Admitting: Internal Medicine

## 2022-05-14 DIAGNOSIS — Z1501 Genetic susceptibility to malignant neoplasm of breast: Secondary | ICD-10-CM

## 2022-05-14 DIAGNOSIS — Z1239 Encounter for other screening for malignant neoplasm of breast: Secondary | ICD-10-CM

## 2022-05-14 MED ORDER — GADOPICLENOL 0.5 MMOL/ML IV SOLN
6.0000 mL | Freq: Once | INTRAVENOUS | Status: AC | PRN
  Administered 2022-05-14: 6 mL via INTRAVENOUS

## 2022-06-13 ENCOUNTER — Encounter: Payer: 59 | Admitting: Internal Medicine

## 2022-06-18 ENCOUNTER — Encounter: Payer: 59 | Admitting: Internal Medicine

## 2022-06-25 ENCOUNTER — Encounter: Payer: 59 | Admitting: Internal Medicine

## 2022-06-25 ENCOUNTER — Other Ambulatory Visit: Payer: Self-pay

## 2022-07-02 ENCOUNTER — Ambulatory Visit: Payer: Self-pay

## 2022-07-08 NOTE — Progress Notes (Unsigned)
Name: Catrena Thurn   MRN: [id number]    DOB: [birth year]   Date:07/09/2022       Progress Note  Subjective  Chief Complaint  Chief Complaint  Patient presents with   Follow-up    HPI  Patient presents for annual CPE.  Diet: well rounded, eats lean proteins  Exercise: walks daily 30 minutes   Last Eye Exam: UTD Last Dental Exam: UTD   Depression: Phq 9 is  negative - currently on Celexa 20 mg, doing well and without side effects    07/09/2022    8:49 AM 02/07/2022    2:09 PM 07/01/2021    1:33 PM 03/18/2021   11:36 AM 06/19/2020    8:45 AM  Depression screen PHQ 2/9  Decreased Interest 0 0 0 0 0  Down, Depressed, Hopeless 0 0 0 0 0  PHQ - 2 Score 0 0 0 0 0  Altered sleeping 0 1 1 1 1   Tired, decreased energy 0 1 1 1 1   Change in appetite 0 0 0 0 0  Feeling bad or failure about yourself  0 0 0 0 0  Trouble concentrating 0 0 1 1 1   Moving slowly or fidgety/restless 0 0 0 0 0  Suicidal thoughts 0 0 0 0 0  PHQ-9 Score 0 2 3 3 3   Difficult doing work/chores Not difficult at all Not difficult at all      Hypertension: BP Readings from Last 3 Encounters:  07/09/22 108/72  02/07/22 118/76  07/01/21 98/60   Obesity: Wt Readings from Last 3 Encounters:  07/09/22 134 lb 9.6 oz (61.1 kg)  02/07/22 139 lb 1.6 oz (63.1 kg)  07/01/21 135 lb 6.4 oz (61.4 kg)   BMI Readings from Last 3 Encounters:  07/09/22 20.47 kg/m  02/07/22 21.15 kg/m  07/01/21 20.52 kg/m     Vaccines:  HPV: uncertain, will check records Tdap: 2022 Shingrix: n/a Pneumonia: n/a Flu: uncertain COVID-19: 2021   Hep C Screening: Due  STD testing and prevention (HIV/chl/gon/syphilis): no concerns  Intimate partner violence: negative screen  LMP: years ago, hx of oophorectomy Discussed importance of follow up if any post-menopausal bleeding: not applicable  Incontinence Symptoms: negative for symptoms   Breast cancer: MRI 05/14/22 - Last breast cancer screening:  breast bilateral MRI 05/14/22 with no  MRI evidence of malignancy involving either the right or left reconstructed breast with a stable normal-appearing lymph node in the outer left breast with recommendations for annual high risk screening MRI due to history of BRCA gene positive  Osteoporosis Prevention : Discussed high calcium and vitamin D supplementation, weight bearing exercises Bone density :no   Cervical cancer screening: Pap 6/22 negative   Skin cancer: Discussed monitoring for atypical lesions  Colorectal cancer: Discussed starting screening at age 69   Lung cancer:  Low Dose CT Chest recommended if Age 61-80 years, 20 pack-year currently smoking OR have quit w/in 15years. Patient does not qualify for screen    Advanced Care Planning: Patient had advanced directives card, scanned into chart.   Lipids: Lab Results  Component Value Date   CHOL 204 (H) 06/13/2021   CHOL 185 06/12/2020   Lab Results  Component Value Date   HDL 87 06/13/2021   HDL 84 06/12/2020   Lab Results  Component Value Date   LDLCALC 105 (H) 06/13/2021   LDLCALC 83 06/12/2020   Lab Results  Component Value Date   TRIG 64 06/13/2021   TRIG 104 06/12/2020   Lab  Results  Component Value Date   CHOLHDL 2.3 06/13/2021   CHOLHDL 2.2 06/12/2020   No results found for: "LDLDIRECT"  Glucose: Glucose  Date Value Ref Range Status  06/13/2021 82 70 - 99 mg/dL Final  25/95/6387 42 (L) 65 - 99 mg/dL Final    Patient Active Problem List   Diagnosis Date Noted   Dyslipidemia, goal LDL below 100 07/07/2021   Mild recurrent major depression (HCC) 06/19/2020   Hypoglycemia 06/19/2020   Encounter to establish care 05/15/2020   BRCA1 gene mutation positive in female 05/15/2020   History of mastectomy, bilateral 05/15/2020   Encounter for breast cancer screening other than mammogram 05/15/2020   History of bilateral salpingo-oophorectomy 05/15/2020   Well woman exam with routine gynecological exam 05/15/2020   Family history of polycystic  kidney disease 05/15/2020   Primary insomnia 05/15/2020    Past Surgical History:  Procedure Laterality Date   BREAST SURGERY N/A    [employer] 03/03/2020   COSMETIC SURGERY  12/21/2016   Breast Implants   LAPAROSCOPIC BILATERAL SALPINGO OOPHERECTOMY Bilateral 2018    Family History  Problem Relation Age of Onset   Breast cancer Mother    Cancer Mother    Depression Mother    Kidney disease Sister     Social History   Socioeconomic History   Marital status: Married    Spouse name: Not on file   Number of children: Not on file   Years of education: Not on file   Highest education level: Not on file  Occupational History   Not on file  Tobacco Use   Smoking status: Never   Smokeless tobacco: Never  Vaping Use   Vaping Use: Never used  Substance and Sexual Activity   Alcohol use: Yes    Alcohol/week: 4.0 standard drinks of alcohol    Types: 4 Standard drinks or equivalent per week   Drug use: Never   Sexual activity: Yes    Birth control/protection: Post-menopausal    Comment: No ovaries due to salpingo oopherectomy  Other Topics Concern   Not on file  Social History Narrative   Not on file   Social Determinants of Health   Financial Resource Strain: Not on file  Food Insecurity: Not on file  Transportation Needs: Not on file  Physical Activity: Not on file  Stress: Not on file  Social Connections: Not on file  Intimate Partner Violence: Not on file     Current Outpatient Medications:    citalopram (CELEXA) 20 MG tablet, Take 1 tablet (20 mg total) by mouth daily. NEEDS APT FOR REFILLS, Disp: 90 tablet, Rfl: 3  No Known Allergies   Review of Systems  All other systems reviewed and are negative.    Objective  Vitals:   07/09/22 0846  BP: 108/72  Pulse: 70  Resp: 18  Temp: 97.7 F (36.5 C)  SpO2: 100%  Weight: 134 lb 9.6 oz (61.1 kg)  Height: 5\' 8"  (1.727 m)    Body mass index is 20.47 kg/m.  Physical Exam Constitutional:       Appearance: Normal appearance.  HENT:     Head: Normocephalic and atraumatic.     Mouth/Throat:     Mouth: Mucous membranes are moist.     Pharynx: Oropharynx is clear.  Eyes:     Extraocular Movements: Extraocular movements intact.     Conjunctiva/sclera: Conjunctivae normal.     Pupils: Pupils are equal, round, and reactive to light.  Neck:  Comments: No thyromegaly Cardiovascular:     Rate and Rhythm: Normal rate and regular rhythm.  Pulmonary:     Effort: Pulmonary effort is normal.     Breath sounds: Normal breath sounds.  Musculoskeletal:     Cervical back: No tenderness.     Right lower leg: No edema.     Left lower leg: No edema.  Lymphadenopathy:     Cervical: No cervical adenopathy.  Skin:    General: Skin is warm and dry.  Neurological:     General: No focal deficit present.     Mental Status: She is alert. Mental status is at baseline.  Psychiatric:        Mood and Affect: Mood normal.        Behavior: Behavior normal.      No results found for this or any previous visit (from the past 2160 hour(s)).   Fall Risk:    07/09/2022    8:46 AM 02/07/2022    2:09 PM 06/19/2020    8:45 AM 05/15/2020    9:55 AM  Fall Risk   Falls in the past year? 0 0 0 0  Number falls in past yr: 0 0 0 0  Injury with Fall? 0 0 0 0  Follow up   Falls evaluation completed Falls evaluation completed     Functional Status Survey: Is the patient deaf or have difficulty hearing?: No Does the patient have difficulty seeing, even when wearing glasses/contacts?: No Does the patient have difficulty concentrating, remembering, or making decisions?: No Does the patient have difficulty walking or climbing stairs?: No Does the patient have difficulty dressing or bathing?: No Does the patient have difficulty doing errands alone such as visiting a doctor's office or shopping?: No   Assessment & Plan  1. Annual physical exam/Lipid screening/Need for hepatitis C screening test/Encounter  for screening for HIV: Annual screening labs ordered.   - CBC w/Diff/Platelet - COMPLETE METABOLIC PANEL WITH GFR - Lipid Profile - Hepatitis C Antibody - HIV antibody (with reflex)  2. Mild recurrent major depression (HCC): Stable, medication refille.d  - citalopram (CELEXA) 20 MG tablet; Take 1 tablet (20 mg total) by mouth daily. NEEDS APT FOR REFILLS  Dispense: 90 tablet; Refill: 3   -USPSTF grade A and B recommendations reviewed with patient; age-appropriate recommendations, preventive care, screening tests, etc discussed and encouraged; healthy living encouraged; see AVS for patient education given to patient -Discussed importance of 150 minutes of physical activity weekly, eat two servings of fish weekly, eat one serving of tree nuts ( cashews, pistachios, pecans, almonds.Marland Kitchen) every other day, eat 6 servings of fruit/vegetables daily and drink plenty of water and avoid sweet beverages.   -Reviewed Health Maintenance: Yes.

## 2022-07-09 ENCOUNTER — Encounter: Payer: Self-pay | Admitting: Internal Medicine

## 2022-07-09 ENCOUNTER — Ambulatory Visit (INDEPENDENT_AMBULATORY_CARE_PROVIDER_SITE_OTHER): Payer: 59 | Admitting: Internal Medicine

## 2022-07-09 VITALS — BP 108/72 | HR 70 | Temp 97.7°F | Resp 18 | Ht 68.0 in | Wt 134.6 lb

## 2022-07-09 DIAGNOSIS — Z114 Encounter for screening for human immunodeficiency virus [HIV]: Secondary | ICD-10-CM

## 2022-07-09 DIAGNOSIS — Z0000 Encounter for general adult medical examination without abnormal findings: Secondary | ICD-10-CM | POA: Diagnosis not present

## 2022-07-09 DIAGNOSIS — Z1159 Encounter for screening for other viral diseases: Secondary | ICD-10-CM | POA: Diagnosis not present

## 2022-07-09 DIAGNOSIS — Z13220 Encounter for screening for lipoid disorders: Secondary | ICD-10-CM | POA: Diagnosis not present

## 2022-07-09 DIAGNOSIS — F330 Major depressive disorder, recurrent, mild: Secondary | ICD-10-CM

## 2022-07-09 LAB — CBC WITH DIFFERENTIAL/PLATELET
Eosinophils Absolute: 160 cells/uL (ref 15–500)
MCH: 30.3 pg (ref 27.0–33.0)
RBC: 4.22 10*6/uL (ref 3.80–5.10)
WBC: 4 10*3/uL (ref 3.8–10.8)

## 2022-07-09 MED ORDER — CITALOPRAM HYDROBROMIDE 20 MG PO TABS
20.0000 mg | ORAL_TABLET | Freq: Every day | ORAL | 3 refills | Status: DC
Start: 2022-07-09 — End: 2023-04-22

## 2022-07-10 LAB — LIPID PANEL
Cholesterol: 169 mg/dL (ref <200)
HDL: 86 mg/dL (ref >=50)
LDL Cholesterol (Calc): 71 mg/dL (calc)
Non-HDL Cholesterol (Calc): 83 mg/dL (calc) (ref <130)
Total CHOL/HDL Ratio: 2 (calc) (ref <5.0)
Triglycerides: 50 mg/dL (ref <150)

## 2022-07-10 LAB — CBC WITH DIFFERENTIAL/PLATELET
Absolute Monocytes: 400 cells/uL (ref 200–950)
Basophils Absolute: 48 cells/uL (ref 0–200)
Basophils Relative: 1.2 %
Eosinophils Relative: 4 %
HCT: 39.1 % (ref 35.0–45.0)
Hemoglobin: 12.8 g/dL (ref 11.7–15.5)
Lymphs Abs: 1692 cells/uL (ref 850–3900)
MCHC: 32.7 g/dL (ref 32.0–36.0)
MCV: 92.7 fL (ref 80.0–100.0)
MPV: 10.1 fL (ref 7.5–12.5)
Monocytes Relative: 10 %
Neutro Abs: 1700 cells/uL (ref 1500–7800)
Neutrophils Relative %: 42.5 %
Platelets: 239 10*3/uL (ref 140–400)
RDW: 12.3 % (ref 11.0–15.0)
Total Lymphocyte: 42.3 %

## 2022-07-10 LAB — COMPLETE METABOLIC PANEL WITH GFR
AG Ratio: 2 (calc) (ref 1.0–2.5)
ALT: 11 U/L (ref 6–29)
AST: 16 U/L (ref 10–30)
Albumin: 4.6 g/dL (ref 3.6–5.1)
Alkaline phosphatase (APISO): 39 U/L (ref 31–125)
BUN: 14 mg/dL (ref 7–25)
CO2: 31 mmol/L (ref 20–32)
Calcium: 9.7 mg/dL (ref 8.6–10.2)
Chloride: 104 mmol/L (ref 98–110)
Creat: 0.87 mg/dL (ref 0.50–0.97)
Globulin: 2.3 g/dL (calc) (ref 1.9–3.7)
Glucose, Bld: 84 mg/dL (ref 65–99)
Potassium: 4.6 mmol/L (ref 3.5–5.3)
Sodium: 141 mmol/L (ref 135–146)
Total Bilirubin: 0.7 mg/dL (ref 0.2–1.2)
Total Protein: 6.9 g/dL (ref 6.1–8.1)
eGFR: 87 mL/min/{1.73_m2} (ref >=60)

## 2022-07-10 LAB — HEPATITIS C ANTIBODY: Hepatitis C Ab: NONREACTIVE

## 2022-07-10 LAB — HIV ANTIBODY (ROUTINE TESTING W REFLEX): HIV 1&2 Ab, 4th Generation: NONREACTIVE

## 2023-04-21 ENCOUNTER — Encounter: Payer: Self-pay | Admitting: Internal Medicine

## 2023-04-22 ENCOUNTER — Other Ambulatory Visit: Payer: Self-pay | Admitting: Internal Medicine

## 2023-04-22 DIAGNOSIS — F330 Major depressive disorder, recurrent, mild: Secondary | ICD-10-CM

## 2023-04-22 MED ORDER — CITALOPRAM HYDROBROMIDE 20 MG PO TABS
20.0000 mg | ORAL_TABLET | Freq: Every day | ORAL | 0 refills | Status: DC
Start: 2023-04-22 — End: 2023-07-24

## 2023-05-11 IMAGING — US US RENAL
1 series · 14 of 25 positions shown · non-contrast
Comparison: None.

CLINICAL DATA: Family history of polycystic kidney disease.

EXAM:
RENAL / URINARY TRACT ULTRASOUND COMPLETE

[Series 1: us renal · 0.23mm/px · 14 of 47 slices shown]
[im 1/47]
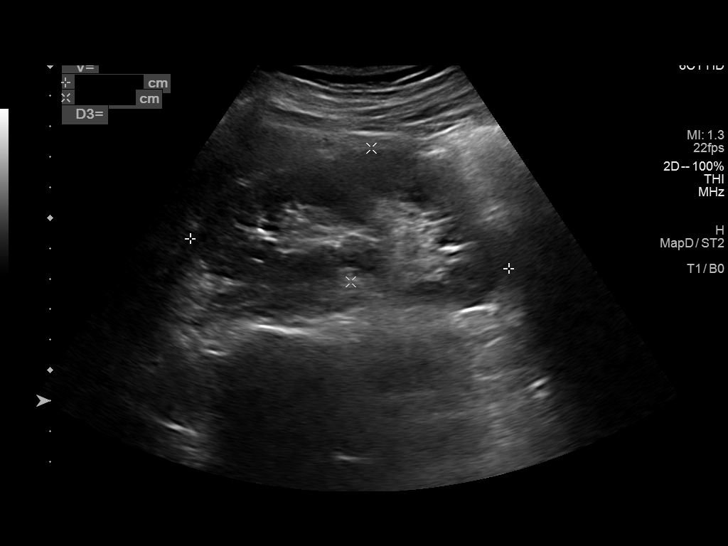
[im 4/47]
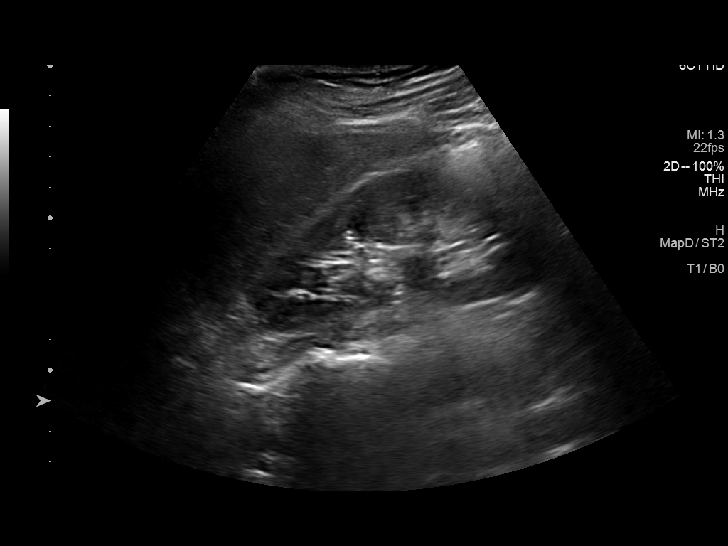
[im 8/47]
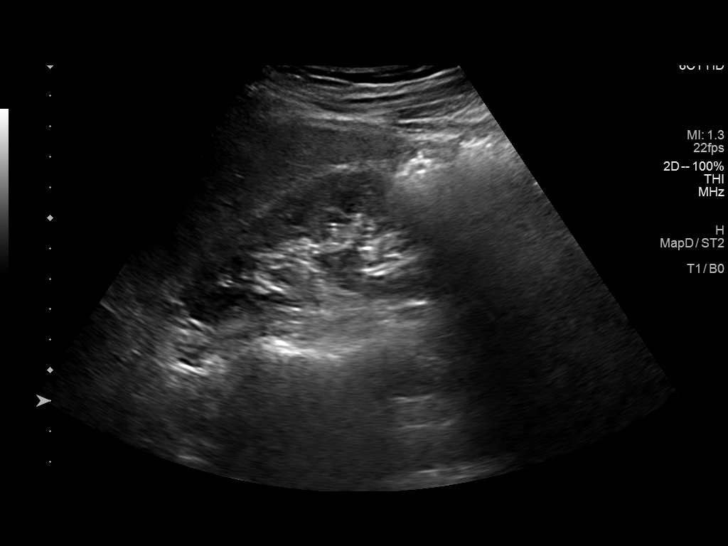
[im 12/47]
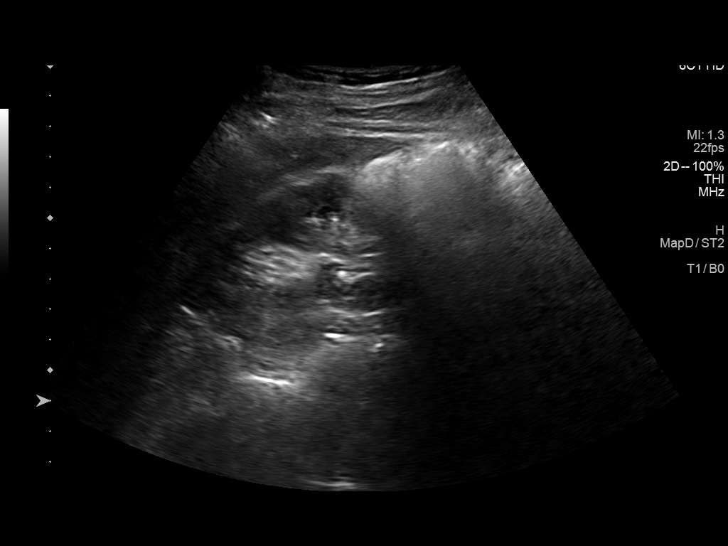
[im 16/47]
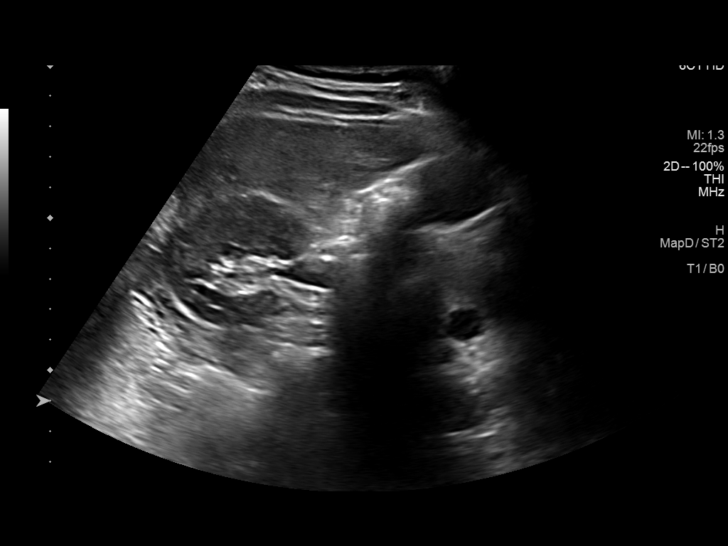
[im 18/47]
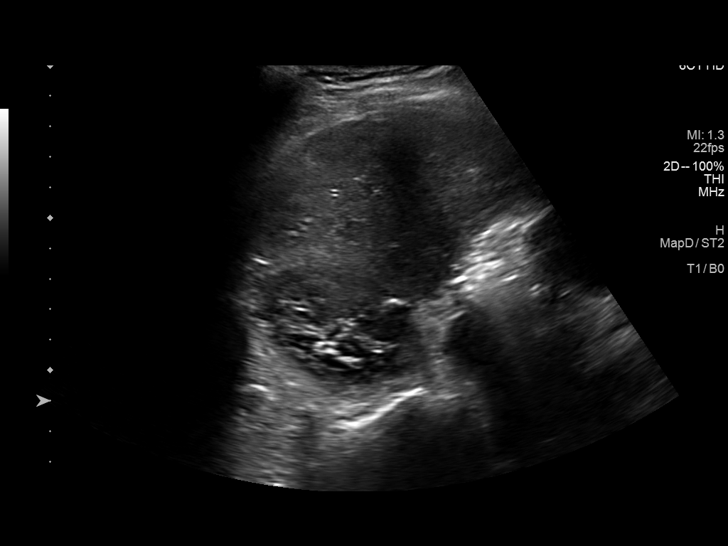
[im 22/47]
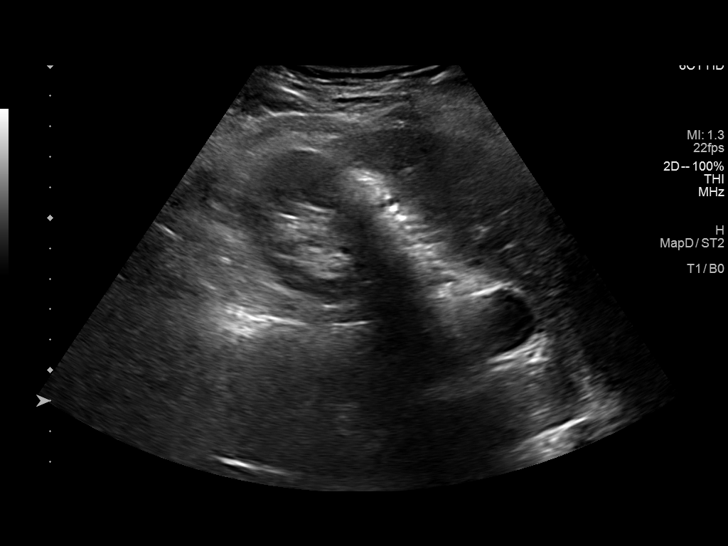
[im 25/47]
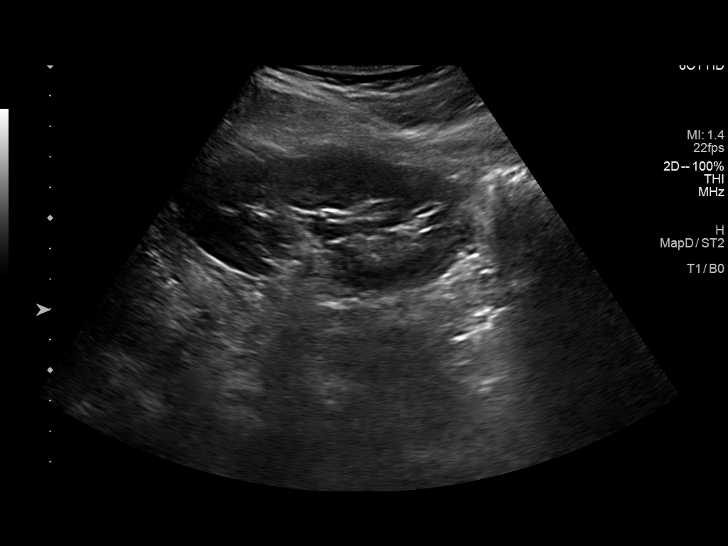
[im 29/47]
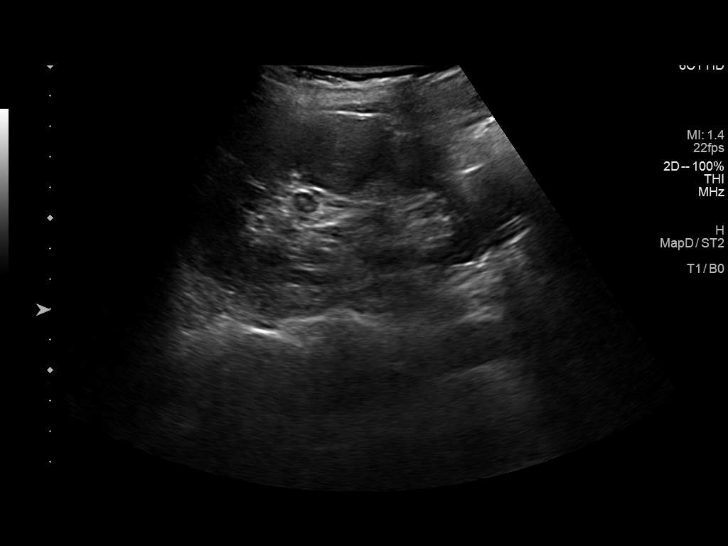
[im 31/47]
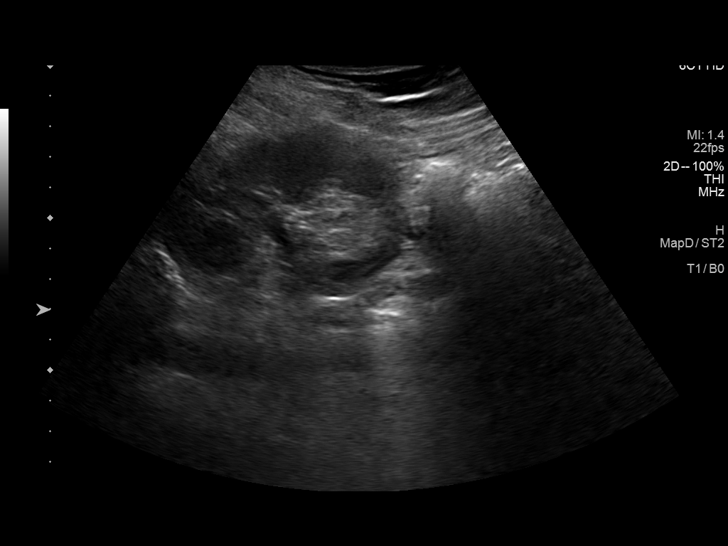
[im 35/47]
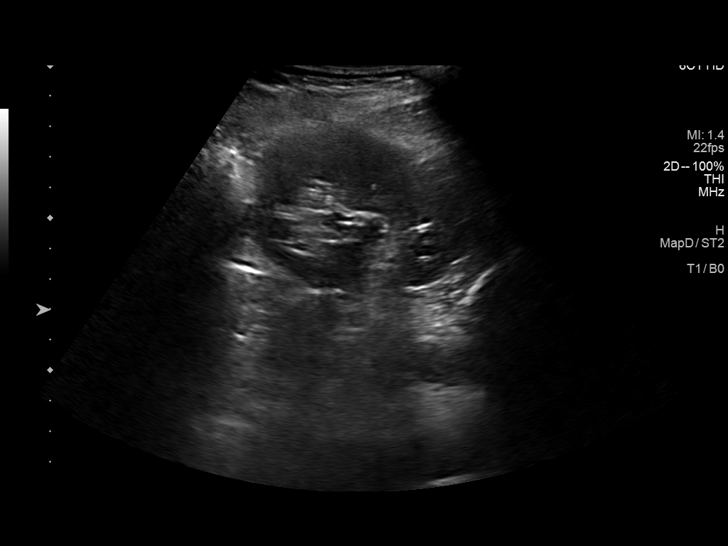
[im 39/47]
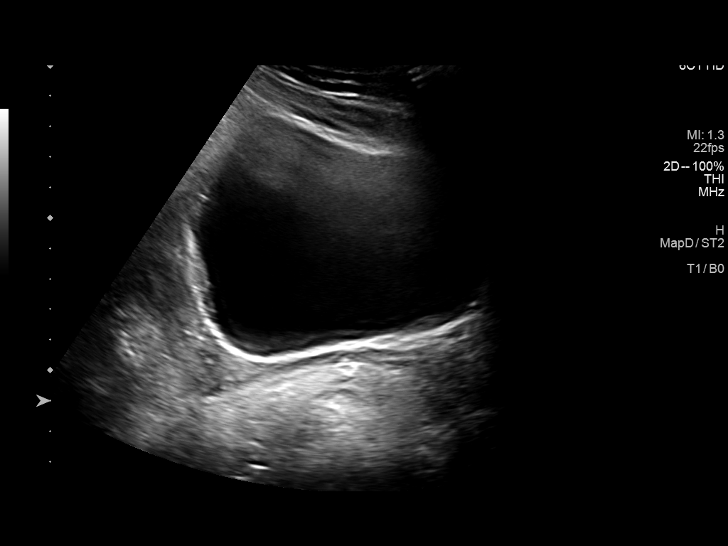
[im 43/47]
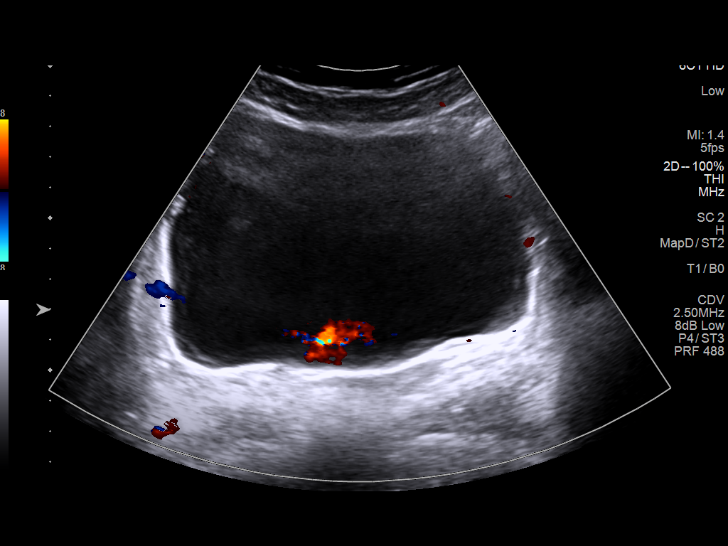
[im 47/47]
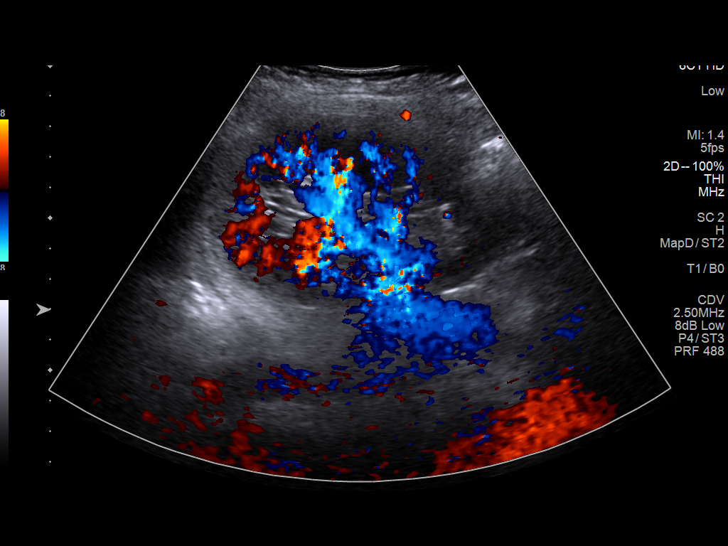

[14 of 25 positions shown; findings below may reference images not displayed]

FINDINGS: Right Kidney:

Renal measurements: 10.5 x 4.4 x 5.0 cm = volume: 121 mL.
Echogenicity within normal limits. No mass or hydronephrosis
visualized.

Left Kidney:

Renal measurements: 10.3 x 4.5 x 7.3 cm = volume: 177 mL.
Echogenicity within normal limits. No mass or hydronephrosis
visualized.

Bladder:

Appears normal for degree of bladder distention.

Other:

None.
IMPRESSION: Unremarkable exam. No findings to suggest polycystic kidney disease.

## 2023-05-20 ENCOUNTER — Ambulatory Visit: Admitting: Nurse Practitioner

## 2023-05-20 ENCOUNTER — Encounter: Payer: Self-pay | Admitting: Nurse Practitioner

## 2023-05-20 VITALS — BP 110/64 | HR 87 | Temp 98.0°F | Ht 68.0 in | Wt 138.2 lb

## 2023-05-20 DIAGNOSIS — R21 Rash and other nonspecific skin eruption: Secondary | ICD-10-CM | POA: Diagnosis not present

## 2023-05-20 MED ORDER — CETIRIZINE HCL 10 MG PO TABS: 10.0000 mg | ORAL_TABLET | Freq: Every day | ORAL | 0 refills | Status: DC

## 2023-05-20 MED ORDER — PREDNISONE 10 MG (21) PO TBPK
ORAL_TABLET | ORAL | 0 refills | Status: DC
Start: 2023-05-20 — End: 2023-07-24

## 2023-05-20 MED ORDER — CLOTRIMAZOLE-BETAMETHASONE 1-0.05 % EX CREA
1.0000 | TOPICAL_CREAM | Freq: Every day | CUTANEOUS | 0 refills | Status: DC
Start: 2023-05-20 — End: 2023-07-24

## 2023-05-20 MED ORDER — FAMOTIDINE 20 MG PO TABS
20.0000 mg | ORAL_TABLET | Freq: Two times a day (BID) | ORAL | 0 refills | Status: DC
Start: 2023-05-20 — End: 2023-07-24

## 2023-05-20 NOTE — Progress Notes (Signed)
 BP 110/64   Pulse 87   Temp 98 F (36.7 C) (Oral)   Ht 5\' 8"  (1.727 m)   Wt 138 lb 3.2 oz (62.7 kg)   LMP  (LMP Unknown) Comment: Salpingo Oopherectomy May 2018  SpO2 99%   BMI 21.01 kg/m    Subjective:    Patient ID: Joyce Joseph, female    DOB: May 14, 1985, 38 y.o.   MRN: [id number]  HPI: Joyce Joseph is a 38 y.o. female  Chief Complaint  Patient presents with   Rash    7-10 days, itchy rash on backside, hip and abdomen, used neosporin and petroleum type ointment with no relief from itching     Discussed the use of AI scribe software for clinical note transcription with the patient, who gave verbal consent to proceed.  History of Present Illness Joyce Joseph is a 38 year old female who presents with a pruritic rash on her buttocks, hip, and abdomen.  The rash began approximately ten days ago, initially presenting as an itch in her buttocks. It is characterized by a very brightly red inflamed area on both sides of her inner cheeks, with small patches and 'teeny bumps'. The rash is extremely itchy.  She has attempted self-treatment with Neosporin and petroleum ointment, but these have provided no relief. No new medications, foods, soaps, detergents, or exposure to unusual environments such as swimming in new places or using hot tubs. The rash has not spread to her chest, but she experienced some itching on her neck this morning, although no bumps were observed in that area.  She has no known allergies and has not experienced similar symptoms in the past. She is not currently taking any allergy medications.         05/20/2023    3:25 PM 07/09/2022    8:49 AM 02/07/2022    2:09 PM  Depression screen PHQ 2/9  Decreased Interest 0 0 0  Down, Depressed, Hopeless 0 0 0  PHQ - 2 Score 0 0 0  Altered sleeping 0 0 1  Tired, decreased energy 0 0 1  Change in appetite 0 0 0  Feeling bad or failure about yourself  0 0 0  Trouble concentrating 0 0 0  Moving slowly or  fidgety/restless 0 0 0  Suicidal thoughts 0 0 0  PHQ-9 Score 0 0 2  Difficult doing work/chores Not difficult at all Not difficult at all Not difficult at all    Relevant past medical, surgical, family and social history reviewed and updated as indicated. Interim medical history since our last visit reviewed. Allergies and medications reviewed and updated.  Review of Systems  Ten systems reviewed and is negative except as mentioned in HPI      Objective:      BP 110/64   Pulse 87   Temp 98 F (36.7 C) (Oral)   Ht 5\' 8"  (1.727 m)   Wt 138 lb 3.2 oz (62.7 kg)   LMP  (LMP Unknown) Comment: Salpingo Oopherectomy May 2018  SpO2 99%   BMI 21.01 kg/m    Wt Readings from Last 3 Encounters:  05/20/23 138 lb 3.2 oz (62.7 kg)  07/09/22 134 lb 9.6 oz (61.1 kg)  02/07/22 139 lb 1.6 oz (63.1 kg)    Physical Exam Vitals reviewed.  Constitutional:      Appearance: Normal appearance.  HENT:     Head: Normocephalic.  Cardiovascular:     Rate and Rhythm: Normal rate.  Pulmonary:  Effort: Pulmonary effort is normal.  Skin:    Findings: Rash present.  Neurological:     General: No focal deficit present.     Mental Status: She is alert and oriented to person, place, and time. Mental status is at baseline.  Psychiatric:        Mood and Affect: Mood normal.        Behavior: Behavior normal.        Thought Content: Thought content normal.        Judgment: Judgment normal.    Physical Exam    Results for orders placed or performed in visit on 07/09/22  CBC w/Diff/Platelet   Collection Time: 07/09/22  9:20 AM  Result Value Ref Range   WBC 4.0 3.8 - 10.8 Thousand/uL   RBC 4.22 3.80 - 5.10 Million/uL   Hemoglobin 12.8 11.7 - 15.5 g/dL   HCT 19.1 47.8 - 29.5 %   MCV 92.7 80.0 - 100.0 fL   MCH 30.3 27.0 - 33.0 pg   MCHC 32.7 32.0 - 36.0 g/dL   RDW 62.1 30.8 - 65.7 %   Platelets 239 140 - 400 Thousand/uL   MPV 10.1 7.5 - 12.5 fL   Neutro Abs 1,700 1,500 - 7,800 cells/uL    Lymphs Abs 1,692 850 - 3,900 cells/uL   Absolute Monocytes 400 200 - 950 cells/uL   Eosinophils Absolute 160 15 - 500 cells/uL   Basophils Absolute 48 0 - 200 cells/uL   Neutrophils Relative % 42.5 %   Total Lymphocyte 42.3 %   Monocytes Relative 10.0 %   Eosinophils Relative 4.0 %   Basophils Relative 1.2 %  COMPLETE METABOLIC PANEL WITH GFR   Collection Time: 07/09/22  9:20 AM  Result Value Ref Range   Glucose, Bld 84 65 - 99 mg/dL   BUN 14 7 - 25 mg/dL   Creat 8.46 9.62 - 9.52 mg/dL   eGFR 87 > OR = 60 WU/XLK/4.40N0   BUN/Creatinine Ratio SEE NOTE: 6 - 22 (calc)   Sodium 141 135 - 146 mmol/L   Potassium 4.6 3.5 - 5.3 mmol/L   Chloride 104 98 - 110 mmol/L   CO2 31 20 - 32 mmol/L   Calcium 9.7 8.6 - 10.2 mg/dL   Total Protein 6.9 6.1 - 8.1 g/dL   Albumin 4.6 3.6 - 5.1 g/dL   Globulin 2.3 1.9 - 3.7 g/dL (calc)   AG Ratio 2.0 1.0 - 2.5 (calc)   Total Bilirubin 0.7 0.2 - 1.2 mg/dL   Alkaline phosphatase (APISO) 39 31 - 125 U/L   AST 16 10 - 30 U/L   ALT 11 6 - 29 U/L  Lipid Profile   Collection Time: 07/09/22  9:20 AM  Result Value Ref Range   Cholesterol 169 <200 mg/dL   HDL 86 > OR = 50 mg/dL   Triglycerides 50 <272 mg/dL   LDL Cholesterol (Calc) 71 mg/dL (calc)   Total CHOL/HDL Ratio 2.0 <5.0 (calc)   Non-HDL Cholesterol (Calc) 83 <536 mg/dL (calc)  Hepatitis C Antibody   Collection Time: 07/09/22  9:20 AM  Result Value Ref Range   Hepatitis C Ab NON-REACTIVE NON-REACTIVE  HIV antibody (with reflex)   Collection Time: 07/09/22  9:20 AM  Result Value Ref Range   HIV 1&2 Ab, 4th Generation NON-REACTIVE NON-REACTIVE          Assessment & Plan:   Problem List Items Addressed This Visit   None Visit Diagnoses       Rash    -  Primary   Relevant Medications   cetirizine (ZYRTEC) 10 MG tablet   famotidine (PEPCID) 20 MG tablet   predniSONE (STERAPRED UNI-PAK 21 TAB) 10 MG (21) TBPK tablet   clotrimazole-betamethasone (LOTRISONE) cream        Assessment  and Plan Assessment & Plan Allergic dermatitis Pruritic rash for 10 days on buttocks, hips, and abdomen. No relief from Neosporin and petroleum ointment. No new medications, foods, or soaps. Differential includes allergic dermatitis and possible fungal infection.  Steroid treatment delayed to avoid insomnia. Combination antifungal/steroid cream prescribed for potential fungal involvement. - Prescribe Zyrtec or Claritin daily. - Prescribe Pepcid twice daily. - Initiate steroid treatment the following day to avoid insomnia. - Prescribe combination antifungal/steroid cream for topical application. - Advise to report if rash worsens or persists. - Consider dermatology referral if no improvement.        Follow up plan: Return if symptoms worsen or fail to improve.

## 2023-05-20 NOTE — Patient Instructions (Signed)
 Pepcid 20 mg once in the am and once in pm  Zyrtec daily  Cream once daily

## 2023-07-24 ENCOUNTER — Encounter: Payer: Self-pay | Admitting: Internal Medicine

## 2023-07-24 ENCOUNTER — Ambulatory Visit: Admitting: Internal Medicine

## 2023-07-24 VITALS — BP 108/58 | HR 90 | Resp 18 | Ht 68.0 in | Wt 137.5 lb

## 2023-07-24 DIAGNOSIS — Z13820 Encounter for screening for osteoporosis: Secondary | ICD-10-CM

## 2023-07-24 DIAGNOSIS — E559 Vitamin D deficiency, unspecified: Secondary | ICD-10-CM | POA: Diagnosis not present

## 2023-07-24 DIAGNOSIS — E2839 Other primary ovarian failure: Secondary | ICD-10-CM

## 2023-07-24 DIAGNOSIS — Z0000 Encounter for general adult medical examination without abnormal findings: Secondary | ICD-10-CM

## 2023-07-24 DIAGNOSIS — Z1239 Encounter for other screening for malignant neoplasm of breast: Secondary | ICD-10-CM

## 2023-07-24 DIAGNOSIS — E8940 Asymptomatic postprocedural ovarian failure: Secondary | ICD-10-CM | POA: Diagnosis not present

## 2023-07-24 DIAGNOSIS — F330 Major depressive disorder, recurrent, mild: Secondary | ICD-10-CM | POA: Diagnosis not present

## 2023-07-24 DIAGNOSIS — Z0001 Encounter for general adult medical examination with abnormal findings: Secondary | ICD-10-CM | POA: Diagnosis not present

## 2023-07-24 MED ORDER — CITALOPRAM HYDROBROMIDE 10 MG PO TABS
10.0000 mg | ORAL_TABLET | Freq: Every day | ORAL | 3 refills | Status: AC
Start: 2023-07-24 — End: ?

## 2023-07-24 NOTE — Progress Notes (Signed)
 Name: Joyce Joseph   MRN: [id number]    DOB: 1985/12/08   Date:07/24/2023       Progress Note  Subjective  Chief Complaint  Chief Complaint  Patient presents with   Annual Exam    HPI  Patient presents for annual CPE.  Discussed the use of AI scribe software for clinical note transcription with the patient, who gave verbal consent to proceed.  History of Present Illness Joyce Joseph is a 38 year old female with a BRCA mutation who presents for annual breast cancer screening and medication management.  She carries the BRCA mutation and has undergone preventative surgeries. She is due for her annual high-risk screening MRI, with the last MRI conducted in May of the previous year.  She takes citalopram  20 mg daily for anxiety, which has been effective but causes emotional blunting and decreased libido. She also uses calcium supplements and over-the-counter sleep aids.  A bone density screening in 2022 showed a slight decrease in bone density in the lumbar spine, left hip, and left forearm, not reaching osteopenia or osteoporosis levels. Her last Pap smear in 2022 was normal with no HPV detected. She has no family history of colon cancer, does not smoke, and her cholesterol, thyroid, and A1c levels are normal. She has a slight prescription for nearsightedness but no significant vision issues. She is not concerned about STD screening and reports no urinary incontinence issues.    Diet: well balanced Exercise: walking regularly   Last Eye Exam: about 2 years ago Last Dental Exam: completed  Flowsheet Row Office Visit from 07/24/2023 in [facility]  AUDIT-C Score 4    Depression: Phq 9 is  negative    05/20/2023    3:25 PM 07/09/2022    8:49 AM 02/07/2022    2:09 PM 07/01/2021    1:33 PM 03/18/2021   11:36 AM  Depression screen PHQ 2/9  Decreased Interest 0 0 0 0 0  Down, Depressed, Hopeless 0 0 0 0 0  PHQ - 2 Score 0 0 0 0 0  Altered sleeping 0 0 1 1 1    Tired, decreased energy 0 0 1 1 1   Change in appetite 0 0 0 0 0  Feeling bad or failure about yourself  0 0 0 0 0  Trouble concentrating 0 0 0 1 1  Moving slowly or fidgety/restless 0 0 0 0 0  Suicidal thoughts 0 0 0 0 0  PHQ-9 Score 0 0 2 3 3   Difficult doing work/chores Not difficult at all Not difficult at all Not difficult at all     Hypertension: BP Readings from Last 3 Encounters:  05/20/23 110/64  07/09/22 108/72  02/07/22 118/76   Obesity: Wt Readings from Last 3 Encounters:  05/20/23 138 lb 3.2 oz (62.7 kg)  07/09/22 134 lb 9.6 oz (61.1 kg)  02/07/22 139 lb 1.6 oz (63.1 kg)   BMI Readings from Last 3 Encounters:  07/24/23 21.01 kg/m  05/20/23 21.01 kg/m  07/09/22 20.47 kg/m     Vaccines: HPV vaccines reviewed with the patient, politely declined due to decrease risk.   Hep C Screening: completed STD testing and prevention (HIV/chl/gon/syphilis): no concerns  Intimate partner violence: negative screen  Sexual History :active History of surgical menopause - BRCA positive, s/p bilateral oophorectomy  Discussed importance of follow up if any post-menopausal bleeding: yes  Incontinence Symptoms: negative for symptoms   Breast cancer:  - Last Breast MRI 05/14/2022  Osteoporosis Prevention : Discussed  high calcium and vitamin D  supplementation, weight bearing exercises Bone density :yes   Cervical cancer screening: up-to-date; Pap 6/22 normal, plan to recheck next year  Skin cancer: Discussed monitoring for atypical lesions  Colorectal cancer: Discussed starting screening at age 70   Lung cancer:  Low Dose CT Chest recommended if Age 79-80 years, 20 pack-year currently smoking OR have quit w/in 15years. Patient does not qualify for screen    Advanced Care Planning: Documents scanned into chart  Patient Active Problem List   Diagnosis Date Noted   Dyslipidemia, goal LDL below 100 07/07/2021   Mild recurrent major depression (HCC) 06/19/2020   Hypoglycemia  06/19/2020   Encounter to establish care 05/15/2020   BRCA1 gene mutation positive in female 05/15/2020   History of mastectomy, bilateral 05/15/2020   Encounter for breast cancer screening other than mammogram 05/15/2020   History of bilateral salpingo-oophorectomy 05/15/2020   Well woman exam with routine gynecological exam 05/15/2020   Family history of polycystic kidney disease 05/15/2020   Primary insomnia 05/15/2020    Past Surgical History:  Procedure Laterality Date   BREAST SURGERY N/A    [employer] 03/03/2020   COSMETIC SURGERY  12/21/2016   Breast Implants   LAPAROSCOPIC BILATERAL SALPINGO OOPHERECTOMY Bilateral 2018    Family History  Problem Relation Age of Onset   Breast cancer Mother    Cancer Mother    Depression Mother    Kidney disease Sister     Social History   Socioeconomic History   Marital status: Married    Spouse name: Not on file   Number of children: Not on file   Years of education: Not on file   Highest education level: Associate degree: occupational, Scientist, product/process development, or vocational program  Occupational History   Not on file  Tobacco Use   Smoking status: Never   Smokeless tobacco: Never  Vaping Use   Vaping status: Never Used  Substance and Sexual Activity   Alcohol use: Yes    Alcohol/week: 4.0 standard drinks of alcohol    Types: 4 Standard drinks or equivalent per week   Drug use: Never   Sexual activity: Yes    Birth control/protection: Post-menopausal    Comment: No ovaries due to salpingo oopherectomy  Other Topics Concern   Not on file  Social History Narrative   Not on file   Social Drivers of Health   Financial Resource Strain: Low Risk  (07/20/2023)   Overall Financial Resource Strain (CARDIA)    Difficulty of Paying Living Expenses: Not very hard  Food Insecurity: No Food Insecurity (07/20/2023)   Hunger Vital Sign    Worried About Running Out of Food in the Last Year: Never true    Ran Out of Food in the Last Year: Never  true  Transportation Needs: No Transportation Needs (07/20/2023)   PRAPARE - Administrator, Civil Service (Medical): No    Lack of Transportation (Non-Medical): No  Physical Activity: Insufficiently Active (07/20/2023)   Exercise Vital Sign    Days of Exercise per Week: 4 days    Minutes of Exercise per Session: 30 min  Stress: No Stress Concern Present (07/20/2023)   [employer] of Occupational Health - Occupational Stress Questionnaire    Feeling of Stress: Only a little  Social Connections: Moderately Integrated (07/20/2023)   Social Connection and Isolation Panel    Frequency of Communication with Friends and Family: More than three times a week    Frequency of Social Gatherings with  Friends and Family: Once a week    Attends Religious Services: More than 4 times per year    Active Member of [employer] or Organizations: No    Attends Engineer, structural: Not on file    Marital Status: Married  Catering manager Violence: Not on file     Current Outpatient Medications:    calcium carbonate (OS-CAL) 1250 (500 Ca) MG chewable tablet, Chew 1 tablet by mouth daily., Disp: , Rfl:    cetirizine  (ZYRTEC ) 10 MG tablet, Take 1 tablet (10 mg total) by mouth daily., Disp: 30 tablet, Rfl: 0   citalopram  (CELEXA ) 20 MG tablet, Take 1 tablet (20 mg total) by mouth daily., Disp: 30 tablet, Rfl: 0   clotrimazole -betamethasone  (LOTRISONE ) cream, Apply 1 Application topically daily., Disp: 30 g, Rfl: 0   diphenhydrAMINE (SOMINEX) 25 MG tablet, Take 25 mg by mouth at bedtime as needed for sleep., Disp: , Rfl:    famotidine  (PEPCID ) 20 MG tablet, Take 1 tablet (20 mg total) by mouth 2 (two) times daily., Disp: 60 tablet, Rfl: 0   predniSONE  (STERAPRED UNI-PAK 21 TAB) 10 MG (21) TBPK tablet, Use as directed., Disp: 21 each, Rfl: 0  No Known Allergies   Review of Systems  All other systems reviewed and are negative.   Objective  Vitals:   07/24/23 1451  Resp: 18  Height:  5' 8 (1.727 m)    Body mass index is 21.01 kg/m.  Physical Exam Constitutional:      Appearance: Normal appearance.  HENT:     Head: Normocephalic and atraumatic.     Mouth/Throat:     Mouth: Mucous membranes are moist.     Pharynx: Oropharynx is clear.  Eyes:     Extraocular Movements: Extraocular movements intact.     Conjunctiva/sclera: Conjunctivae normal.     Pupils: Pupils are equal, round, and reactive to light.  Neck:     Comments: No thyromegaly Cardiovascular:     Rate and Rhythm: Normal rate and regular rhythm.  Pulmonary:     Effort: Pulmonary effort is normal.     Breath sounds: Normal breath sounds.  Musculoskeletal:     Cervical back: No tenderness.     Right lower leg: No edema.     Left lower leg: No edema.  Lymphadenopathy:     Cervical: No cervical adenopathy.  Skin:    General: Skin is warm and dry.  Neurological:     General: No focal deficit present.     Mental Status: She is alert. Mental status is at baseline.  Psychiatric:        Mood and Affect: Mood normal.        Behavior: Behavior normal.     Last CBC Lab Results  Component Value Date   WBC 4.0 07/09/2022   HGB 12.8 07/09/2022   HCT 39.1 07/09/2022   MCV 92.7 07/09/2022   MCH 30.3 07/09/2022   RDW 12.3 07/09/2022   PLT 239 07/09/2022   Last metabolic panel Lab Results  Component Value Date   GLUCOSE 84 07/09/2022   NA 141 07/09/2022   K 4.6 07/09/2022   CL 104 07/09/2022   CO2 31 07/09/2022   BUN 14 07/09/2022   CREATININE 0.87 07/09/2022   EGFR 87 07/09/2022   CALCIUM 9.7 07/09/2022   PROT 6.9 07/09/2022   ALBUMIN 5.0 (H) 06/13/2021   LABGLOB 2.2 06/13/2021   AGRATIO 2.3 (H) 06/13/2021   BILITOT 0.7 07/09/2022   ALKPHOS 55 06/13/2021  AST 16 07/09/2022   ALT 11 07/09/2022   Last lipids Lab Results  Component Value Date   CHOL 169 07/09/2022   HDL 86 07/09/2022   LDLCALC 71 07/09/2022   TRIG 50 07/09/2022   CHOLHDL 2.0 07/09/2022   Last hemoglobin  A1c Lab Results  Component Value Date   HGBA1C 5.3 06/13/2021   Last thyroid functions Lab Results  Component Value Date   TSH 1.140 06/13/2021   Last vitamin D  Lab Results  Component Value Date   VD25OH 43.5 06/13/2021   Last vitamin B12 and Folate No results found for: VITAMINB12, FOLATE    Assessment & Plan  Assessment & Plan Breast Cancer Screening BRCA mutation increases breast cancer risk. Annual MRI recommended despite preventative surgeries. - Order annual MRI for breast cancer screening.  Citalopram  Side Effects Citalopram  effective for anxiety but causes emotional blunting and decreased libido. Dose reduction to 10 mg recommended. - Prescribe citalopram  10 mg tablets. - Instruct her to adjust dose between 10 mg and 20 mg based on symptoms. - Advise her to report if side effects persist or worsen.  Bone Density Monitoring 2022 screening showed decreased bone density, not reaching osteopenia or osteoporosis. Regular monitoring needed due to surgical menopause. - Order bone density scan.  Annual Physical/General Health Maintenance Vaccinations up to date. Declined HPV vaccine. Normal Pap smear in 2022. Colon cancer screening to start at age 24. No smoking history. Cholesterol optimal last year. Takes calcium supplements. - Plan Pap smear in 2026. - Begin colon cancer screening at age 38. - Check vitamin D  levels. - Perform annual labs excluding cholesterol. - Provide contact information for scheduling bone scan and MRI.  Follow-up Follow-up scheduled for next year unless issues arise. - Schedule follow-up visit in one year.  - CBC w/Diff/Platelet - Comprehensive Metabolic Panel (CMET) - Vitamin D  (25 hydroxy) - MR BREAST BILATERAL W WO CONTRAST INC CAD; Future - DG BONE DENSITY (DXA); Future - Vitamin D  (25 hydroxy) - citalopram  (CELEXA ) 10 MG tablet; Take 1 tablet (10 mg total) by mouth daily.  Dispense: 90 tablet; Refill: 3   -USPSTF grade A and B  recommendations reviewed with patient; age-appropriate recommendations, preventive care, screening tests, etc discussed and encouraged; healthy living encouraged; see AVS for patient education given to patient -Discussed importance of 150 minutes of physical activity weekly, eat two servings of fish weekly, eat one serving of tree nuts ( cashews, pistachios, pecans, almonds.SABRA) every other day, eat 6 servings of fruit/vegetables daily and drink plenty of water and avoid sweet beverages.   -Reviewed Health Maintenance: Yes.

## 2023-07-25 LAB — COMPREHENSIVE METABOLIC PANEL WITH GFR
AG Ratio: 1.9 (calc) (ref 1.0–2.5)
ALT: 10 U/L (ref 6–29)
AST: 15 U/L (ref 10–30)
Albumin: 5 g/dL (ref 3.6–5.1)
Alkaline phosphatase (APISO): 54 U/L (ref 31–125)
BUN: 20 mg/dL (ref 7–25)
CO2: 31 mmol/L (ref 20–32)
Calcium: 9.8 mg/dL (ref 8.6–10.2)
Chloride: 101 mmol/L (ref 98–110)
Creat: 0.94 mg/dL (ref 0.50–0.99)
Globulin: 2.6 g/dL (ref 1.9–3.7)
Glucose, Bld: 84 mg/dL (ref 65–99)
Potassium: 4.7 mmol/L (ref 3.5–5.3)
Sodium: 140 mmol/L (ref 135–146)
Total Bilirubin: 0.8 mg/dL (ref 0.2–1.2)
Total Protein: 7.6 g/dL (ref 6.1–8.1)
eGFR: 79 mL/min/1.73m2 (ref >=60)

## 2023-07-25 LAB — CBC WITH DIFFERENTIAL/PLATELET
Absolute Lymphocytes: 2583 {cells}/uL (ref 850–3900)
Absolute Monocytes: 529 {cells}/uL (ref 200–950)
Basophils Absolute: 32 {cells}/uL (ref 0–200)
Basophils Relative: 0.4 %
Eosinophils Absolute: 142 {cells}/uL (ref 15–500)
Eosinophils Relative: 1.8 %
HCT: 40.2 % (ref 35.0–45.0)
Hemoglobin: 13.1 g/dL (ref 11.7–15.5)
MCH: 29.6 pg (ref 27.0–33.0)
MCHC: 32.6 g/dL (ref 32.0–36.0)
MCV: 91 fL (ref 80.0–100.0)
MPV: 9.6 fL (ref 7.5–12.5)
Monocytes Relative: 6.7 %
Neutro Abs: 4614 {cells}/uL (ref 1500–7800)
Neutrophils Relative %: 58.4 %
Platelets: 266 Thousand/uL (ref 140–400)
RBC: 4.42 Million/uL (ref 3.80–5.10)
RDW: 12.7 % (ref 11.0–15.0)
Total Lymphocyte: 32.7 %
WBC: 7.9 Thousand/uL (ref 3.8–10.8)

## 2023-07-25 LAB — VITAMIN D 25 HYDROXY (VIT D DEFICIENCY, FRACTURES): Vit D, 25-Hydroxy: 43 ng/mL (ref 30–100)

## 2023-07-27 ENCOUNTER — Ambulatory Visit: Payer: Self-pay | Admitting: Internal Medicine

## 2023-08-04 ENCOUNTER — Encounter: Payer: Self-pay | Admitting: Internal Medicine

## 2023-08-28 ENCOUNTER — Ambulatory Visit
Admission: RE | Admit: 2023-08-28 | Discharge: 2023-08-28 | Disposition: A | Discharge status: Home / Self Care | Source: Ambulatory Visit | Attending: Internal Medicine | Admitting: Internal Medicine

## 2023-08-28 DIAGNOSIS — Z0000 Encounter for general adult medical examination without abnormal findings: Secondary | ICD-10-CM

## 2023-08-28 DIAGNOSIS — Z1239 Encounter for other screening for malignant neoplasm of breast: Secondary | ICD-10-CM

## 2023-08-28 MED ORDER — GADOPICLENOL 0.5 MMOL/ML IV SOLN
6.0000 mL | Freq: Once | INTRAVENOUS | Status: AC | PRN
  Administered 2023-08-28: 6 mL via INTRAVENOUS

## 2023-12-11 ENCOUNTER — Encounter: Payer: Self-pay | Admitting: Internal Medicine

## 2024-02-24 ENCOUNTER — Other Ambulatory Visit (HOSPITAL_BASED_OUTPATIENT_CLINIC_OR_DEPARTMENT_OTHER)

## 2024-07-29 ENCOUNTER — Encounter: Admitting: Internal Medicine
